# Patient Record
Sex: Female | Born: 1935 | Race: White | Hispanic: No | Marital: Single | Smoking: Never smoker
Health system: Southern US, Community
[De-identification: ages and names within clinical notes are randomized; demographics above are authoritative.]

## PROBLEM LIST (undated history)

## (undated) DIAGNOSIS — I10 Essential (primary) hypertension: Secondary | ICD-10-CM

## (undated) DIAGNOSIS — E119 Type 2 diabetes mellitus without complications: Secondary | ICD-10-CM

## (undated) DIAGNOSIS — M1990 Unspecified osteoarthritis, unspecified site: Secondary | ICD-10-CM

---

## 2014-11-24 ENCOUNTER — Emergency Department (HOSPITAL_COMMUNITY): Payer: Medicare PPO | Admitting: Anesthesiology

## 2014-11-24 ENCOUNTER — Inpatient Hospital Stay (HOSPITAL_COMMUNITY): Payer: Medicare PPO

## 2014-11-24 ENCOUNTER — Encounter (HOSPITAL_COMMUNITY): Payer: Self-pay | Admitting: Emergency Medicine

## 2014-11-24 ENCOUNTER — Encounter (HOSPITAL_COMMUNITY): Admission: EM | Disposition: E | Payer: Self-pay | Source: Home / Self Care | Attending: Internal Medicine

## 2014-11-24 ENCOUNTER — Inpatient Hospital Stay (HOSPITAL_COMMUNITY)
Admission: EM | Admit: 2014-11-24 | Discharge: 2014-12-16 | DRG: 480 | Disposition: E | Payer: Medicare PPO | Attending: Internal Medicine | Admitting: Internal Medicine

## 2014-11-24 ENCOUNTER — Emergency Department (HOSPITAL_COMMUNITY): Payer: Medicare PPO

## 2014-11-24 DIAGNOSIS — M179 Osteoarthritis of knee, unspecified: Secondary | ICD-10-CM | POA: Diagnosis present

## 2014-11-24 DIAGNOSIS — D649 Anemia, unspecified: Secondary | ICD-10-CM | POA: Diagnosis not present

## 2014-11-24 DIAGNOSIS — R001 Bradycardia, unspecified: Secondary | ICD-10-CM | POA: Diagnosis not present

## 2014-11-24 DIAGNOSIS — E1151 Type 2 diabetes mellitus with diabetic peripheral angiopathy without gangrene: Secondary | ICD-10-CM | POA: Diagnosis present

## 2014-11-24 DIAGNOSIS — E875 Hyperkalemia: Secondary | ICD-10-CM | POA: Diagnosis not present

## 2014-11-24 DIAGNOSIS — I129 Hypertensive chronic kidney disease with stage 1 through stage 4 chronic kidney disease, or unspecified chronic kidney disease: Secondary | ICD-10-CM | POA: Diagnosis present

## 2014-11-24 DIAGNOSIS — Z66 Do not resuscitate: Secondary | ICD-10-CM | POA: Diagnosis present

## 2014-11-24 DIAGNOSIS — W19XXXA Unspecified fall, initial encounter: Secondary | ICD-10-CM

## 2014-11-24 DIAGNOSIS — E1122 Type 2 diabetes mellitus with diabetic chronic kidney disease: Secondary | ICD-10-CM | POA: Diagnosis present

## 2014-11-24 DIAGNOSIS — N179 Acute kidney failure, unspecified: Secondary | ICD-10-CM | POA: Diagnosis not present

## 2014-11-24 DIAGNOSIS — Z452 Encounter for adjustment and management of vascular access device: Secondary | ICD-10-CM | POA: Insufficient documentation

## 2014-11-24 DIAGNOSIS — S7222XA Displaced subtrochanteric fracture of left femur, initial encounter for closed fracture: Secondary | ICD-10-CM | POA: Diagnosis present

## 2014-11-24 DIAGNOSIS — E872 Acidosis: Secondary | ICD-10-CM | POA: Diagnosis not present

## 2014-11-24 DIAGNOSIS — E873 Alkalosis: Secondary | ICD-10-CM | POA: Diagnosis not present

## 2014-11-24 DIAGNOSIS — Y92012 Bathroom of single-family (private) house as the place of occurrence of the external cause: Secondary | ICD-10-CM | POA: Diagnosis not present

## 2014-11-24 DIAGNOSIS — S7290XA Unspecified fracture of unspecified femur, initial encounter for closed fracture: Secondary | ICD-10-CM | POA: Diagnosis not present

## 2014-11-24 DIAGNOSIS — J9600 Acute respiratory failure, unspecified whether with hypoxia or hypercapnia: Secondary | ICD-10-CM | POA: Diagnosis not present

## 2014-11-24 DIAGNOSIS — Z515 Encounter for palliative care: Secondary | ICD-10-CM | POA: Diagnosis not present

## 2014-11-24 DIAGNOSIS — S72002A Fracture of unspecified part of neck of left femur, initial encounter for closed fracture: Secondary | ICD-10-CM

## 2014-11-24 DIAGNOSIS — S7292XA Unspecified fracture of left femur, initial encounter for closed fracture: Secondary | ICD-10-CM | POA: Diagnosis not present

## 2014-11-24 DIAGNOSIS — I998 Other disorder of circulatory system: Secondary | ICD-10-CM | POA: Diagnosis not present

## 2014-11-24 DIAGNOSIS — Z7982 Long term (current) use of aspirin: Secondary | ICD-10-CM

## 2014-11-24 DIAGNOSIS — M25552 Pain in left hip: Secondary | ICD-10-CM | POA: Diagnosis present

## 2014-11-24 DIAGNOSIS — T40605A Adverse effect of unspecified narcotics, initial encounter: Secondary | ICD-10-CM | POA: Diagnosis not present

## 2014-11-24 DIAGNOSIS — S72009A Fracture of unspecified part of neck of unspecified femur, initial encounter for closed fracture: Secondary | ICD-10-CM | POA: Diagnosis not present

## 2014-11-24 DIAGNOSIS — D72829 Elevated white blood cell count, unspecified: Secondary | ICD-10-CM | POA: Diagnosis not present

## 2014-11-24 DIAGNOSIS — R401 Stupor: Secondary | ICD-10-CM | POA: Diagnosis not present

## 2014-11-24 DIAGNOSIS — I959 Hypotension, unspecified: Secondary | ICD-10-CM | POA: Diagnosis not present

## 2014-11-24 DIAGNOSIS — N189 Chronic kidney disease, unspecified: Secondary | ICD-10-CM | POA: Diagnosis present

## 2014-11-24 DIAGNOSIS — K7200 Acute and subacute hepatic failure without coma: Secondary | ICD-10-CM | POA: Diagnosis not present

## 2014-11-24 DIAGNOSIS — R4182 Altered mental status, unspecified: Secondary | ICD-10-CM | POA: Insufficient documentation

## 2014-11-24 DIAGNOSIS — R4020 Unspecified coma: Secondary | ICD-10-CM | POA: Diagnosis not present

## 2014-11-24 DIAGNOSIS — W010XXA Fall on same level from slipping, tripping and stumbling without subsequent striking against object, initial encounter: Secondary | ICD-10-CM | POA: Diagnosis present

## 2014-11-24 DIAGNOSIS — R40241 Glasgow coma scale score 13-15: Secondary | ICD-10-CM | POA: Diagnosis not present

## 2014-11-24 DIAGNOSIS — R571 Hypovolemic shock: Secondary | ICD-10-CM | POA: Diagnosis not present

## 2014-11-24 DIAGNOSIS — M6282 Rhabdomyolysis: Secondary | ICD-10-CM | POA: Diagnosis not present

## 2014-11-24 HISTORY — DX: Type 2 diabetes mellitus without complications: E11.9

## 2014-11-24 HISTORY — PX: INTRAMEDULLARY (IM) NAIL INTERTROCHANTERIC: SHX5875

## 2014-11-24 HISTORY — DX: Unspecified osteoarthritis, unspecified site: M19.90

## 2014-11-24 HISTORY — DX: Essential (primary) hypertension: I10

## 2014-11-24 LAB — IRON AND TIBC
IRON: 47 ug/dL (ref 28–170)
SATURATION RATIOS: 19 % (ref 10.4–31.8)
TIBC: 253 ug/dL (ref 250–450)
UIBC: 206 ug/dL

## 2014-11-24 LAB — CBC WITH DIFFERENTIAL/PLATELET
BASOS ABS: 0 10*3/uL (ref 0.0–0.1)
Basophils Relative: 0 % (ref 0–1)
EOS ABS: 0 10*3/uL (ref 0.0–0.7)
Eosinophils Relative: 0 % (ref 0–5)
HCT: 33.9 % — ABNORMAL LOW (ref 36.0–46.0)
HEMOGLOBIN: 10.9 g/dL — AB (ref 12.0–15.0)
Lymphocytes Relative: 6 % — ABNORMAL LOW (ref 12–46)
Lymphs Abs: 0.9 10*3/uL (ref 0.7–4.0)
MCH: 27.5 pg (ref 26.0–34.0)
MCHC: 32.2 g/dL (ref 30.0–36.0)
MCV: 85.6 fL (ref 78.0–100.0)
MONOS PCT: 4 % (ref 3–12)
Monocytes Absolute: 0.7 10*3/uL (ref 0.1–1.0)
NEUTROS PCT: 90 % — AB (ref 43–77)
Neutro Abs: 15.1 10*3/uL — ABNORMAL HIGH (ref 1.7–7.7)
PLATELETS: 217 10*3/uL (ref 150–400)
RBC: 3.96 MIL/uL (ref 3.87–5.11)
RDW: 13 % (ref 11.5–15.5)
WBC: 16.8 10*3/uL — ABNORMAL HIGH (ref 4.0–10.5)

## 2014-11-24 LAB — CBC
HEMATOCRIT: 28.3 % — AB (ref 36.0–46.0)
Hemoglobin: 8.8 g/dL — ABNORMAL LOW (ref 12.0–15.0)
MCH: 27.4 pg (ref 26.0–34.0)
MCHC: 31.1 g/dL (ref 30.0–36.0)
MCV: 88.2 fL (ref 78.0–100.0)
PLATELETS: 210 10*3/uL (ref 150–400)
RBC: 3.21 MIL/uL — ABNORMAL LOW (ref 3.87–5.11)
RDW: 13.4 % (ref 11.5–15.5)
WBC: 12 10*3/uL — AB (ref 4.0–10.5)

## 2014-11-24 LAB — GLUCOSE, CAPILLARY
GLUCOSE-CAPILLARY: 282 mg/dL — AB (ref 65–99)
Glucose-Capillary: 279 mg/dL — ABNORMAL HIGH (ref 65–99)
Glucose-Capillary: 245 mg/dL — ABNORMAL HIGH (ref 65–99)
Glucose-Capillary: 202 mg/dL — ABNORMAL HIGH (ref 65–99)
Glucose-Capillary: 243 mg/dL — ABNORMAL HIGH (ref 65–99)

## 2014-11-24 LAB — BASIC METABOLIC PANEL
ANION GAP: 9 (ref 5–15)
BUN: 22 mg/dL — ABNORMAL HIGH (ref 6–20)
CALCIUM: 9.5 mg/dL (ref 8.9–10.3)
CO2: 24 mmol/L (ref 22–32)
CREATININE: 1.67 mg/dL — AB (ref 0.44–1.00)
Chloride: 107 mmol/L (ref 101–111)
GFR calc Af Amer: 31 mL/min — ABNORMAL LOW (ref >60)
GFR calc non Af Amer: 27 mL/min — ABNORMAL LOW (ref >60)
Glucose, Bld: 221 mg/dL — ABNORMAL HIGH (ref 65–99)
Potassium: 3.9 mmol/L (ref 3.5–5.1)
Sodium: 140 mmol/L (ref 135–145)

## 2014-11-24 LAB — POCT I-STAT 4, (NA,K, GLUC, HGB,HCT)
Glucose, Bld: 349 mg/dL — ABNORMAL HIGH (ref 65–99)
HCT: 30 % — ABNORMAL LOW (ref 36.0–46.0)
HEMOGLOBIN: 10.2 g/dL — AB (ref 12.0–15.0)
Potassium: 4.5 mmol/L (ref 3.5–5.1)
Sodium: 143 mmol/L (ref 135–145)

## 2014-11-24 LAB — RETICULOCYTES
RBC.: 3.21 MIL/uL — ABNORMAL LOW (ref 3.87–5.11)
Retic Count, Absolute: 51.4 10*3/uL (ref 19.0–186.0)
Retic Ct Pct: 1.6 % (ref 0.4–3.1)

## 2014-11-24 LAB — FERRITIN: Ferritin: 1418 ng/mL — ABNORMAL HIGH (ref 11–307)

## 2014-11-24 LAB — CREATININE, SERUM
Creatinine, Ser: 2.34 mg/dL — ABNORMAL HIGH (ref 0.44–1.00)
GFR calc non Af Amer: 18 mL/min — ABNORMAL LOW (ref >60)
GFR, EST AFRICAN AMERICAN: 21 mL/min — AB (ref >60)

## 2014-11-24 LAB — FOLATE: Folate: 22.8 ng/mL (ref >5.9)

## 2014-11-24 LAB — VITAMIN B12: Vitamin B-12: 296 pg/mL (ref 180–914)

## 2014-11-24 LAB — PROTIME-INR
INR: 1.16 (ref 0.00–1.49)
Prothrombin Time: 15 seconds (ref 11.6–15.2)

## 2014-11-24 SURGERY — FIXATION, FRACTURE, INTERTROCHANTERIC, WITH INTRAMEDULLARY ROD
Anesthesia: General | Site: Leg Upper | Laterality: Left

## 2014-11-24 MED ORDER — INSULIN ASPART 100 UNIT/ML ~~LOC~~ SOLN
0.0000 [IU] | SUBCUTANEOUS | Status: DC
  Administered 2014-11-24: 1 [IU] via SUBCUTANEOUS
  Administered 2014-11-24: 3 [IU] via SUBCUTANEOUS
  Administered 2014-11-25: 1 [IU] via SUBCUTANEOUS
  Administered 2014-11-25: 3 [IU] via SUBCUTANEOUS
  Administered 2014-11-25: 1 [IU] via SUBCUTANEOUS

## 2014-11-24 MED ORDER — PHENYLEPHRINE HCL 10 MG/ML IJ SOLN
10.0000 mg | INTRAVENOUS | Status: DC | PRN
  Administered 2014-11-24: 60 ug/min via INTRAVENOUS

## 2014-11-24 MED ORDER — METOCLOPRAMIDE HCL 10 MG PO TABS: 5.0000 mg | ORAL_TABLET | Freq: Three times a day (TID) | ORAL | Status: DC | PRN

## 2014-11-24 MED ORDER — FENTANYL CITRATE (PF) 100 MCG/2ML IJ SOLN: 25.0000 ug | INTRAMUSCULAR | Status: DC | PRN

## 2014-11-24 MED ORDER — LIDOCAINE HCL (CARDIAC) 20 MG/ML IV SOLN
INTRAVENOUS | Status: DC | PRN
  Administered 2014-11-24: 40 mg via INTRAVENOUS

## 2014-11-24 MED ORDER — BUPIVACAINE-EPINEPHRINE 0.5% -1:200000 IJ SOLN
INTRAMUSCULAR | Status: DC | PRN
  Administered 2014-11-24: 10 mL

## 2014-11-24 MED ORDER — ACETAMINOPHEN 325 MG PO TABS: 650.0000 mg | ORAL_TABLET | Freq: Four times a day (QID) | ORAL | Status: DC | PRN

## 2014-11-24 MED ORDER — SODIUM CHLORIDE 0.9 % IV SOLN
INTRAVENOUS | Status: DC
  Administered 2014-11-24: 75 mL/h via INTRAVENOUS
  Administered 2014-11-25: 10:00:00 via INTRAVENOUS

## 2014-11-24 MED ORDER — ENOXAPARIN SODIUM 30 MG/0.3ML ~~LOC~~ SOLN
30.0000 mg | SUBCUTANEOUS | Status: DC
  Filled 2014-11-24: qty 0.3

## 2014-11-24 MED ORDER — ONDANSETRON HCL 4 MG/2ML IJ SOLN
4.0000 mg | Freq: Once | INTRAMUSCULAR | Status: AC
  Administered 2014-11-24: 4 mg via INTRAVENOUS
  Filled 2014-11-24: qty 2

## 2014-11-24 MED ORDER — ONDANSETRON HCL 4 MG/2ML IJ SOLN
INTRAMUSCULAR | Status: AC
  Filled 2014-11-24: qty 2

## 2014-11-24 MED ORDER — ONDANSETRON HCL 4 MG/2ML IJ SOLN: 4.0000 mg | Freq: Once | INTRAMUSCULAR | Status: DC

## 2014-11-24 MED ORDER — CEFAZOLIN SODIUM-DEXTROSE 2-3 GM-% IV SOLR
INTRAVENOUS | Status: DC | PRN
  Administered 2014-11-24: 2 g via INTRAVENOUS

## 2014-11-24 MED ORDER — NALOXONE HCL 0.4 MG/ML IJ SOLN
0.4000 mg | Freq: Once | INTRAMUSCULAR | Status: AC
  Administered 2014-11-24: 0.4 mg via INTRAVENOUS
  Filled 2014-11-24: qty 1

## 2014-11-24 MED ORDER — FENTANYL CITRATE (PF) 100 MCG/2ML IJ SOLN
50.0000 ug | INTRAMUSCULAR | Status: DC | PRN
  Administered 2014-11-24: 50 ug via INTRAVENOUS
  Filled 2014-11-24: qty 2

## 2014-11-24 MED ORDER — ONDANSETRON HCL 4 MG/2ML IJ SOLN
4.0000 mg | Freq: Four times a day (QID) | INTRAMUSCULAR | Status: DC | PRN
  Administered 2014-11-25: 4 mg via INTRAVENOUS
  Filled 2014-11-24: qty 2

## 2014-11-24 MED ORDER — ACETAMINOPHEN 325 MG RE SUPP
487.5000 mg | RECTAL | Status: DC
  Filled 2014-11-24 (×9): qty 2

## 2014-11-24 MED ORDER — DOCUSATE SODIUM 100 MG PO CAPS
100.0000 mg | ORAL_CAPSULE | Freq: Two times a day (BID) | ORAL | Status: DC
Start: 2014-11-24 — End: 2014-11-26
  Filled 2014-11-24 (×2): qty 1

## 2014-11-24 MED ORDER — SODIUM CHLORIDE 0.9 % IV SOLN: INTRAVENOUS | Status: AC

## 2014-11-24 MED ORDER — HYDROCODONE-ACETAMINOPHEN 5-325 MG PO TABS: 1.0000 | ORAL_TABLET | Freq: Four times a day (QID) | ORAL | Status: DC | PRN

## 2014-11-24 MED ORDER — CEFAZOLIN SODIUM-DEXTROSE 2-3 GM-% IV SOLR
INTRAVENOUS | Status: AC
  Filled 2014-11-24: qty 50

## 2014-11-24 MED ORDER — METOCLOPRAMIDE HCL 5 MG PO TABS: 5.0000 mg | ORAL_TABLET | Freq: Three times a day (TID) | ORAL | Status: DC | PRN

## 2014-11-24 MED ORDER — SODIUM CHLORIDE 0.9 % IV BOLUS (SEPSIS)
1000.0000 mL | Freq: Once | INTRAVENOUS | Status: AC
  Administered 2014-11-24: 1000 mL via INTRAVENOUS

## 2014-11-24 MED ORDER — PHENOL 1.4 % MT LIQD: 1.0000 | OROMUCOSAL | Status: DC | PRN

## 2014-11-24 MED ORDER — SODIUM CHLORIDE 0.9 % IV SOLN
250.0000 [IU] | INTRAVENOUS | Status: DC | PRN
  Administered 2014-11-24: 5.8 [IU]/h via INTRAVENOUS

## 2014-11-24 MED ORDER — HYDRALAZINE HCL 20 MG/ML IJ SOLN
10.0000 mg | Freq: Once | INTRAMUSCULAR | Status: AC
  Administered 2014-11-24: 10 mg via INTRAVENOUS
  Filled 2014-11-24: qty 1

## 2014-11-24 MED ORDER — EPHEDRINE SULFATE 50 MG/ML IJ SOLN
INTRAMUSCULAR | Status: AC
  Filled 2014-11-24: qty 1

## 2014-11-24 MED ORDER — ACETAMINOPHEN 500 MG PO TABS
500.0000 mg | ORAL_TABLET | ORAL | Status: DC
  Filled 2014-11-24: qty 1

## 2014-11-24 MED ORDER — ROCURONIUM BROMIDE 50 MG/5ML IV SOLN
INTRAVENOUS | Status: AC
Start: 2014-11-24 — End: 2014-11-24
  Filled 2014-11-24: qty 1

## 2014-11-24 MED ORDER — MEPERIDINE HCL 25 MG/ML IJ SOLN: 6.2500 mg | INTRAMUSCULAR | Status: DC | PRN

## 2014-11-24 MED ORDER — MORPHINE SULFATE 2 MG/ML IJ SOLN: 2.0000 mg | INTRAMUSCULAR | Status: DC | PRN

## 2014-11-24 MED ORDER — NALOXONE HCL 0.4 MG/ML IJ SOLN
INTRAMUSCULAR | Status: AC
  Administered 2014-11-24: 0.4 mg
  Filled 2014-11-24: qty 1

## 2014-11-24 MED ORDER — SODIUM CHLORIDE 0.45 % IV SOLN: INTRAVENOUS | Status: DC

## 2014-11-24 MED ORDER — EPHEDRINE SULFATE 50 MG/ML IJ SOLN
INTRAMUSCULAR | Status: DC | PRN
  Administered 2014-11-24 (×2): 5 mg via INTRAVENOUS

## 2014-11-24 MED ORDER — FLEET ENEMA 7-19 GM/118ML RE ENEM: 1.0000 | ENEMA | Freq: Once | RECTAL | Status: AC | PRN

## 2014-11-24 MED ORDER — SUCCINYLCHOLINE CHLORIDE 20 MG/ML IJ SOLN
INTRAMUSCULAR | Status: AC
  Filled 2014-11-24: qty 1

## 2014-11-24 MED ORDER — ACETAMINOPHEN 650 MG RE SUPP: 650.0000 mg | Freq: Four times a day (QID) | RECTAL | Status: DC | PRN

## 2014-11-24 MED ORDER — PROPOFOL 10 MG/ML IV BOLUS
INTRAVENOUS | Status: DC | PRN
  Administered 2014-11-24: 50 mg via INTRAVENOUS

## 2014-11-24 MED ORDER — LIDOCAINE HCL (CARDIAC) 20 MG/ML IV SOLN
INTRAVENOUS | Status: AC
  Filled 2014-11-24: qty 5

## 2014-11-24 MED ORDER — SODIUM CHLORIDE 0.9 % IV SOLN
INTRAVENOUS | Status: DC | PRN
  Administered 2014-11-24: 15:00:00 via INTRAVENOUS

## 2014-11-24 MED ORDER — BUPIVACAINE-EPINEPHRINE (PF) 0.5% -1:200000 IJ SOLN
INTRAMUSCULAR | Status: AC
  Filled 2014-11-24: qty 30

## 2014-11-24 MED ORDER — ONDANSETRON HCL 4 MG PO TABS: 4.0000 mg | ORAL_TABLET | Freq: Four times a day (QID) | ORAL | Status: DC | PRN

## 2014-11-24 MED ORDER — FENTANYL CITRATE (PF) 250 MCG/5ML IJ SOLN
INTRAMUSCULAR | Status: DC | PRN
  Administered 2014-11-24: 25 ug via INTRAVENOUS

## 2014-11-24 MED ORDER — BUPIVACAINE-EPINEPHRINE (PF) 0.25% -1:200000 IJ SOLN
INTRAMUSCULAR | Status: AC
Start: 2014-11-24 — End: 2014-11-24
  Filled 2014-11-24: qty 30

## 2014-11-24 MED ORDER — SUCCINYLCHOLINE CHLORIDE 20 MG/ML IJ SOLN
INTRAMUSCULAR | Status: DC | PRN
  Administered 2014-11-24: 100 mg via INTRAVENOUS

## 2014-11-24 MED ORDER — PROPOFOL 10 MG/ML IV BOLUS
INTRAVENOUS | Status: AC
Start: 2014-11-24 — End: 2014-11-24
  Filled 2014-11-24: qty 20

## 2014-11-24 MED ORDER — SODIUM CHLORIDE 0.9 % IV SOLN
INTRAVENOUS | Status: DC
  Filled 2014-11-24: qty 2.5

## 2014-11-24 MED ORDER — FENTANYL CITRATE (PF) 250 MCG/5ML IJ SOLN
INTRAMUSCULAR | Status: AC
  Filled 2014-11-24: qty 5

## 2014-11-24 MED ORDER — METOCLOPRAMIDE HCL 5 MG/ML IJ SOLN: 5.0000 mg | Freq: Three times a day (TID) | INTRAMUSCULAR | Status: DC | PRN

## 2014-11-24 MED ORDER — NALOXONE HCL 0.4 MG/ML IJ SOLN
INTRAMUSCULAR | Status: AC
  Filled 2014-11-24: qty 1

## 2014-11-24 MED ORDER — MENTHOL 3 MG MT LOZG: 1.0000 | LOZENGE | OROMUCOSAL | Status: DC | PRN

## 2014-11-24 MED ORDER — METOCLOPRAMIDE HCL 5 MG/ML IJ SOLN
5.0000 mg | Freq: Three times a day (TID) | INTRAMUSCULAR | Status: DC | PRN
  Filled 2014-11-24: qty 1

## 2014-11-24 MED ORDER — ONDANSETRON HCL 4 MG/2ML IJ SOLN
INTRAMUSCULAR | Status: DC | PRN
  Administered 2014-11-24: 4 mg via INTRAVENOUS

## 2014-11-24 MED ORDER — BISACODYL 10 MG RE SUPP: 10.0000 mg | Freq: Every day | RECTAL | Status: DC | PRN

## 2014-11-24 MED ORDER — 0.9 % SODIUM CHLORIDE (POUR BTL) OPTIME
TOPICAL | Status: DC | PRN
  Administered 2014-11-24: 1000 mL

## 2014-11-24 SURGICAL SUPPLY — 46 items
BIT DRILL 4.3MMS DISTAL GRDTED (BIT) ×1 IMPLANT
BNDG COHESIVE 4X5 TAN STRL (GAUZE/BANDAGES/DRESSINGS) ×6 IMPLANT
CANISTER SUCTION 2500CC (MISCELLANEOUS) ×3 IMPLANT
COVER MAYO STAND STRL (DRAPES) ×3 IMPLANT
COVER PERINEAL POST (MISCELLANEOUS) ×3 IMPLANT
COVER SURGICAL LIGHT HANDLE (MISCELLANEOUS) ×3 IMPLANT
DRAPE C-ARM 42X72 X-RAY (DRAPES) ×3 IMPLANT
DRAPE STERI IOBAN 125X83 (DRAPES) ×3 IMPLANT
DRILL 4.3MMS DISTAL GRADUATED (BIT) ×3
DRSG PAD ABDOMINAL 8X10 ST (GAUZE/BANDAGES/DRESSINGS) ×3 IMPLANT
DURAPREP 26ML APPLICATOR (WOUND CARE) ×3 IMPLANT
ELECT REM PT RETURN 9FT ADLT (ELECTROSURGICAL) ×3
ELECTRODE REM PT RTRN 9FT ADLT (ELECTROSURGICAL) ×1 IMPLANT
EVACUATOR 1/8 PVC DRAIN (DRAIN) IMPLANT
GAUZE SPONGE 4X4 12PLY STRL (GAUZE/BANDAGES/DRESSINGS) ×3 IMPLANT
GAUZE XEROFORM 1X8 LF (GAUZE/BANDAGES/DRESSINGS) ×3 IMPLANT
GLOVE BIOGEL PI IND STRL 8 (GLOVE) ×2 IMPLANT
GLOVE BIOGEL PI INDICATOR 8 (GLOVE) ×4
GLOVE ORTHO TXT STRL SZ7.5 (GLOVE) ×6 IMPLANT
GOWN STRL REUS W/ TWL LRG LVL3 (GOWN DISPOSABLE) ×1 IMPLANT
GOWN STRL REUS W/TWL 2XL LVL3 (GOWN DISPOSABLE) ×6 IMPLANT
GOWN STRL REUS W/TWL LRG LVL3 (GOWN DISPOSABLE) ×5 IMPLANT
GUIDEWIRE BALL NOSE 80CM (WIRE) ×3 IMPLANT
HFN LH 130 DEG 11MM X 380MM (Orthopedic Implant) ×3 IMPLANT
KIT BASIN OR (CUSTOM PROCEDURE TRAY) ×3 IMPLANT
KIT ROOM TURNOVER OR (KITS) ×3 IMPLANT
LINER BOOT UNIVERSAL DISP (MISCELLANEOUS) IMPLANT
MANIFOLD NEPTUNE II (INSTRUMENTS) IMPLANT
NEEDLE HYPO 25GX1X1/2 BEV (NEEDLE) ×3 IMPLANT
NS IRRIG 1000ML POUR BTL (IV SOLUTION) ×3 IMPLANT
PACK GENERAL/GYN (CUSTOM PROCEDURE TRAY) ×3 IMPLANT
PAD ARMBOARD 7.5X6 YLW CONV (MISCELLANEOUS) ×6 IMPLANT
PIN GUIDE 3.2 903003004 (MISCELLANEOUS) ×3 IMPLANT
SCREW BONE CORTICAL 5.0X44 (Screw) ×3 IMPLANT
SCREW LAG 10.5MMX105MM HFN (Screw) ×3 IMPLANT
SCREWDRIVER HEX TIP 3.5MM (MISCELLANEOUS) ×3 IMPLANT
STAPLER VISISTAT 35W (STAPLE) ×3 IMPLANT
SUT VIC AB 0 CT1 27 (SUTURE) ×4
SUT VIC AB 0 CT1 27XBRD ANBCTR (SUTURE) ×2 IMPLANT
SUT VIC AB 1 CT1 27 (SUTURE) ×2
SUT VIC AB 1 CT1 27XBRD ANBCTR (SUTURE) ×1 IMPLANT
SUT VIC AB 2-0 CT1 27 (SUTURE)
SUT VIC AB 2-0 CT1 TAPERPNT 27 (SUTURE) IMPLANT
SYR CONTROL 10ML LL (SYRINGE) ×3 IMPLANT
TAPE CLOTH SURG 4X10 WHT LF (GAUZE/BANDAGES/DRESSINGS) ×3 IMPLANT
WATER STERILE IRR 1000ML POUR (IV SOLUTION) IMPLANT

## 2014-11-24 NOTE — ED Notes (Signed)
Fall while walking in home; slip/trip, denies dizziness or symptoms prior to fall. Landed on left hip. Pain present with palpation and movement, rotation present. No LOC with fall. Given  Morphine in route for pain.

## 2014-11-24 NOTE — Progress Notes (Signed)
[facility] answering service related to gluco stabilizer orders. RN awaiting call back.

## 2014-11-24 NOTE — ED Notes (Signed)
Admitting team at bedside.

## 2014-11-24 NOTE — H&P (View-Only) (Signed)
Reason for Consult:left femur IT/ST hip fracture Referring Physician: Regenia Skeeter MD  Belinda Newman is an 81 y.o. female.  HPI: 81 yo community ambulator goes to [employer] and to the store with family members fell in the bathroom with acute left hip pain. Neg LOC, Neg dizziness. No past Hx of hip pain. Hip very painful, short and ER. Neg Hx of hip OA.    Past Medical History  Diagnosis Date  . Diabetes mellitus without complication   . Hypertension   . Arthritis     History reviewed. No pertinent past surgical history.  History reviewed. No pertinent family history.  Social History:  reports that she has never smoked. She does not have any smokeless tobacco history on file. She reports that she does not drink alcohol or use illicit drugs.  Allergies: No Known Allergies  Medications: I have reviewed the patient's current medications.  Results for orders placed or performed during the hospital encounter of 11/18/2014 (from the past 48 hour(s))  Basic metabolic panel     Status: Abnormal   Collection Time: 11/30/2014 11:45 AM  Result Value Ref Range   Sodium 140 135 - 145 mmol/L   Potassium 3.9 3.5 - 5.1 mmol/L   Chloride 107 101 - 111 mmol/L   CO2 24 22 - 32 mmol/L   Glucose, Bld 221 (H) 65 - 99 mg/dL   BUN 22 (H) 6 - 20 mg/dL   Creatinine, Ser 1.67 (H) 0.44 - 1.00 mg/dL   Calcium 9.5 8.9 - 10.3 mg/dL   GFR calc non Af Amer 27 (L) >60 mL/min   GFR calc Af Amer 31 (L) >60 mL/min    Comment: (NOTE) The eGFR has been calculated using the CKD EPI equation. This calculation has not been validated in all clinical situations. eGFR's persistently <60 mL/min signify possible Chronic Kidney Disease.    Anion gap 9 5 - 15  CBC WITH DIFFERENTIAL     Status: Abnormal   Collection Time: 12/03/2014 11:45 AM  Result Value Ref Range   WBC 16.8 (H) 4.0 - 10.5 K/uL   RBC 3.96 3.87 - 5.11 MIL/uL   Hemoglobin 10.9 (L) 12.0 - 15.0 g/dL   HCT 33.9 (L) 36.0 - 46.0 %   MCV 85.6 78.0 - 100.0 fL   MCH 27.5 26.0 - 34.0 pg   MCHC 32.2 30.0 - 36.0 g/dL   RDW 13.0 11.5 - 15.5 %   Platelets 217 150 - 400 K/uL   Neutrophils Relative % 90 (H) 43 - 77 %   Neutro Abs 15.1 (H) 1.7 - 7.7 K/uL   Lymphocytes Relative 6 (L) 12 - 46 %   Lymphs Abs 0.9 0.7 - 4.0 K/uL   Monocytes Relative 4 3 - 12 %   Monocytes Absolute 0.7 0.1 - 1.0 K/uL   Eosinophils Relative 0 0 - 5 %   Eosinophils Absolute 0.0 0.0 - 0.7 K/uL   Basophils Relative 0 0 - 1 %   Basophils Absolute 0.0 0.0 - 0.1 K/uL  Protime-INR     Status: None   Collection Time: 12/05/2014 11:45 AM  Result Value Ref Range   Prothrombin Time 15.0 11.6 - 15.2 seconds   INR 1.16 0.00 - 1.49  Type and screen     Status: None   Collection Time: 12/07/2014 11:50 AM  Result Value Ref Range   ABO/RH(D) B POS    Antibody Screen NEG    Sample Expiration 11/27/2014     Dg Chest 1 View  12/15/2014   CLINICAL DATA:  Fall in bathroom this morning. Hypertension and diabetes.  EXAM: CHEST  1 VIEW  COMPARISON:  None.  FINDINGS: The heart size and mediastinal contours are within normal limits. Both lungs are clear. Mild elevation of left hemidiaphragm noted. No evidence of pleural effusion.  IMPRESSION: No active disease.   Electronically Signed   By: Earle Gell M.D.   On: 12/15/2014 11:44   Dg Hip Unilat With Pelvis 2-3 Views Left  11/19/2014   CLINICAL DATA:  Fall with left hip pain today.  Initial encounter.  EXAM: DG HIP (WITH OR WITHOUT PELVIS) 2-3V LEFT  COMPARISON:  None.  FINDINGS: An oblique mildly comminuted fracture of the proximal left femur is identified extending 15 cm from the greater trochanter inferiorly. 2 cm anterior and lateral displacement is identified.  There is no evidence of subluxation or dislocation.  Mild degenerative changes within both hips are present.  IMPRESSION: Oblique mildly comminuted displaced fracture of the proximal left femur as described.   Electronically Signed   By: Margarette Canada M.D.   On: 11/22/2014 11:45    Review of  Systems  Constitutional: Negative for fever and weight loss.  Eyes:       Glasses  Respiratory: Negative for cough and sputum production.   Cardiovascular: Negative for chest pain.  Gastrointestinal: Negative for vomiting.  Genitourinary: Negative for dysuria.  Musculoskeletal: Negative for myalgias.  Skin: Negative for rash.  Neurological: Negative for dizziness, tremors and seizures.  Endo/Heme/Allergies: Does not bruise/bleed easily.   Blood pressure 105/46, pulse 67, temperature 97.9 F (36.6 C), temperature source Oral, resp. rate 21, height 5' 6"  (1.676 m), weight 68.04 kg (150 lb), SpO2 92 %. Physical Exam  Constitutional: She is oriented to person, place, and time. She appears well-developed and well-nourished.  HENT:  Head: Atraumatic.  Eyes: Pupils are equal, round, and reactive to light.  Neck: Normal range of motion.  Cardiovascular: Normal rate and regular rhythm.   Respiratory: Effort normal and breath sounds normal.  GI: Soft.  Musculoskeletal:  Pulses intact, hip lateral skin intact. Pulses and sensation intact left LE . Short and ER left LE.   Neurological: She is alert and oriented to person, place, and time.  Skin: Skin is warm.  Psychiatric: She has a normal mood and affect. Her behavior is normal.    Assessment/Plan: Left IT/ST hip /proximal femoral shaft fracture.   Had 65m Morphine given with oversedation after EMS brought her to ER. BP improved with fluid.   Plan surgery discussed with sister, daughter . All ?'s answered . Pt and family request we proceed risks of bleeding infection , screw cutout and reoperation, anesthetic risks , heart attack , stroke discussed.    YATES,MARK C 12/02/2014, 3:00 PM

## 2014-11-24 NOTE — Significant Event (Signed)
Rapid Response Event Note  Overview: Time Called: 2045 Arrival Time: 2050 Event Type: Neurologic  Initial Focused Assessment: Pt was unresponsive to verbal and pain stimuli. Airway is patent, heart sounds regular with rate 48-54, lungs sounds diminished, skin warm with cool extremities. Pupils 2mm and sluggish. Sensation was intact.   Interventions: Patient was given Narcan 0.4mg  X 2.   Event Summary: Patient had returned from PACU today around 17:35 per family. Patient had been sleeping since return. Staff and family had difficulty arousing patient this evening around 20:30. There was concern for oversedation. Patient was given Narcan 0.4mg  X 2 before responding to any stimuli. MAE X 4. CBG was 202-245 during event. Patient was placed on a continuous pulse ox and remained on RA. No further interventions were required during event. Provider responded to bedside.   Name of Physician Notified: Dr. Venia MinksNicholas Taylor at 2045    at 2045  Outcome: Stayed in room and stabalized  Event End Time: 2115  Belinda Newman, Belinda Newman

## 2014-11-24 NOTE — Consult Note (Signed)
Reason for Consult:left femur IT/ST hip fracture Referring Physician: Regenia Skeeter MD  Belinda Newman is an 81 y.o. female.  HPI: 81 yo community ambulator goes to [employer] and to the store with family members fell in the bathroom with acute left hip pain. Neg LOC, Neg dizziness. No past Hx of hip pain. Hip very painful, short and ER. Neg Hx of hip OA.    Past Medical History  Diagnosis Date  . Diabetes mellitus without complication   . Hypertension   . Arthritis     History reviewed. No pertinent past surgical history.  History reviewed. No pertinent family history.  Social History:  reports that she has never smoked. She does not have any smokeless tobacco history on file. She reports that she does not drink alcohol or use illicit drugs.  Allergies: No Known Allergies  Medications: I have reviewed the patient's current medications.  Results for orders placed or performed during the hospital encounter of 11/22/2014 (from the past 48 hour(s))  Basic metabolic panel     Status: Abnormal   Collection Time: 12/15/2014 11:45 AM  Result Value Ref Range   Sodium 140 135 - 145 mmol/L   Potassium 3.9 3.5 - 5.1 mmol/L   Chloride 107 101 - 111 mmol/L   CO2 24 22 - 32 mmol/L   Glucose, Bld 221 (H) 65 - 99 mg/dL   BUN 22 (H) 6 - 20 mg/dL   Creatinine, Ser 1.67 (H) 0.44 - 1.00 mg/dL   Calcium 9.5 8.9 - 10.3 mg/dL   GFR calc non Af Amer 27 (L) >60 mL/min   GFR calc Af Amer 31 (L) >60 mL/min    Comment: (NOTE) The eGFR has been calculated using the CKD EPI equation. This calculation has not been validated in all clinical situations. eGFR's persistently <60 mL/min signify possible Chronic Kidney Disease.    Anion gap 9 5 - 15  CBC WITH DIFFERENTIAL     Status: Abnormal   Collection Time: 11/19/2014 11:45 AM  Result Value Ref Range   WBC 16.8 (H) 4.0 - 10.5 K/uL   RBC 3.96 3.87 - 5.11 MIL/uL   Hemoglobin 10.9 (L) 12.0 - 15.0 g/dL   HCT 33.9 (L) 36.0 - 46.0 %   MCV 85.6 78.0 - 100.0 fL   MCH 27.5 26.0 - 34.0 pg   MCHC 32.2 30.0 - 36.0 g/dL   RDW 13.0 11.5 - 15.5 %   Platelets 217 150 - 400 K/uL   Neutrophils Relative % 90 (H) 43 - 77 %   Neutro Abs 15.1 (H) 1.7 - 7.7 K/uL   Lymphocytes Relative 6 (L) 12 - 46 %   Lymphs Abs 0.9 0.7 - 4.0 K/uL   Monocytes Relative 4 3 - 12 %   Monocytes Absolute 0.7 0.1 - 1.0 K/uL   Eosinophils Relative 0 0 - 5 %   Eosinophils Absolute 0.0 0.0 - 0.7 K/uL   Basophils Relative 0 0 - 1 %   Basophils Absolute 0.0 0.0 - 0.1 K/uL  Protime-INR     Status: None   Collection Time: 11/27/2014 11:45 AM  Result Value Ref Range   Prothrombin Time 15.0 11.6 - 15.2 seconds   INR 1.16 0.00 - 1.49  Type and screen     Status: None   Collection Time: 12/11/2014 11:50 AM  Result Value Ref Range   ABO/RH(D) B POS    Antibody Screen NEG    Sample Expiration 11/27/2014     Dg Chest 1 View  11/27/2014   CLINICAL DATA:  Fall in bathroom this morning. Hypertension and diabetes.  EXAM: CHEST  1 VIEW  COMPARISON:  None.  FINDINGS: The heart size and mediastinal contours are within normal limits. Both lungs are clear. Mild elevation of left hemidiaphragm noted. No evidence of pleural effusion.  IMPRESSION: No active disease.   Electronically Signed   By: Earle Gell M.D.   On: 11/28/2014 11:44   Dg Hip Unilat With Pelvis 2-3 Views Left  12/11/2014   CLINICAL DATA:  Fall with left hip pain today.  Initial encounter.  EXAM: DG HIP (WITH OR WITHOUT PELVIS) 2-3V LEFT  COMPARISON:  None.  FINDINGS: An oblique mildly comminuted fracture of the proximal left femur is identified extending 15 cm from the greater trochanter inferiorly. 2 cm anterior and lateral displacement is identified.  There is no evidence of subluxation or dislocation.  Mild degenerative changes within both hips are present.  IMPRESSION: Oblique mildly comminuted displaced fracture of the proximal left femur as described.   Electronically Signed   By: Margarette Canada M.D.   On: 11/16/2014 11:45    Review of  Systems  Constitutional: Negative for fever and weight loss.  Eyes:       Glasses  Respiratory: Negative for cough and sputum production.   Cardiovascular: Negative for chest pain.  Gastrointestinal: Negative for vomiting.  Genitourinary: Negative for dysuria.  Musculoskeletal: Negative for myalgias.  Skin: Negative for rash.  Neurological: Negative for dizziness, tremors and seizures.  Endo/Heme/Allergies: Does not bruise/bleed easily.   Blood pressure 105/46, pulse 67, temperature 97.9 F (36.6 C), temperature source Oral, resp. rate 21, height 5' 6"  (1.676 m), weight 68.04 kg (150 lb), SpO2 92 %. Physical Exam  Constitutional: She is oriented to person, place, and time. She appears well-developed and well-nourished.  HENT:  Head: Atraumatic.  Eyes: Pupils are equal, round, and reactive to light.  Neck: Normal range of motion.  Cardiovascular: Normal rate and regular rhythm.   Respiratory: Effort normal and breath sounds normal.  GI: Soft.  Musculoskeletal:  Pulses intact, hip lateral skin intact. Pulses and sensation intact left LE . Short and ER left LE.   Neurological: She is alert and oriented to person, place, and time.  Skin: Skin is warm.  Psychiatric: She has a normal mood and affect. Her behavior is normal.    Assessment/Plan: Left IT/ST hip /proximal femoral shaft fracture.   Had 10m Morphine given with oversedation after EMS brought her to ER. BP improved with fluid.   Plan surgery discussed with sister, daughter . All ?'s answered . Pt and family request we proceed risks of bleeding infection , screw cutout and reoperation, anesthetic risks , heart attack , stroke discussed.    Belinda Newman C 12/15/2014, 3:00 PM

## 2014-11-24 NOTE — ED Notes (Signed)
Dr. Ophelia CharterYates at bedside discussing plan with patient and family.

## 2014-11-24 NOTE — ED Notes (Signed)
Pt moving arms and head to sternal rub, mumbling incomprehensibly.

## 2014-11-24 NOTE — Brief Op Note (Signed)
12/06/2014  5:11 PM  PATIENT:  Belinda Newman  81 y.o. female  PRE-OPERATIVE DIAGNOSIS:  LEFT IT/ST HIP FRACTURE  POST-OPERATIVE DIAGNOSIS:  LEFT IT/ST HIP FRACTURE  PROCEDURE:  Procedure(s): INTRAMEDULLARY (IM) NAIL INTERTROCHANTRIC (Left) affixis long nail with interlocks  SURGEON:  Surgeon(s) and Role:    * Eldred MangesMark C Sanford Lindblad, MD - Primary  PHYSICIAN ASSISTANT:   ASSISTANTS: none   ANESTHESIA:   general plus local   EBL:  Total I/O In: -  Out: 100 [Blood:100]  BLOOD ADMINISTERED:none  DRAINS: none   LOCAL MEDICATIONS USED:  MARCAINE     SPECIMEN:  No Specimen  DISPOSITION OF SPECIMEN:  N/A  COUNTS:  YES  TOURNIQUET:  * No tourniquets in log *  DICTATION: .Other Dictation: Dictation Number [id number]  PLAN OF CARE: Admit to inpatient   PATIENT DISPOSITION:  PACU - hemodynamically stable.   Delay start of Pharmacological VTE agent (>24hrs) due to surgical blood loss or risk of bleeding: yes

## 2014-11-24 NOTE — ED Notes (Signed)
Pt responding verbally.

## 2014-11-24 NOTE — Anesthesia Procedure Notes (Signed)
Procedure Name: Intubation Date/Time: 12/15/2014 3:38 PM Performed by: Alanda AmassFRIEDMAN, Traevion Poehler A Pre-anesthesia Checklist: Patient identified, Timeout performed, Emergency Drugs available, Suction available and Patient being monitored Patient Re-evaluated:Patient Re-evaluated prior to inductionOxygen Delivery Method: Circle system utilized Preoxygenation: Pre-oxygenation with 100% oxygen Intubation Type: IV induction Ventilation: Mask ventilation without difficulty Laryngoscope Size: Mac and 3 Grade View: Grade I Tube type: Oral Tube size: 7.5 mm Number of attempts: 1 Airway Equipment and Method: Stylet Placement Confirmation: ETT inserted through vocal cords under direct vision,  breath sounds checked- equal and bilateral and positive ETCO2 Secured at: 20 cm Tube secured with: Tape Dental Injury: Teeth and Oropharynx as per pre-operative assessment

## 2014-11-24 NOTE — Progress Notes (Signed)
RN went in to discontinue insulin drip and administer novolog sliding scale (3 units) per MD order. Patients family (daughter Eber JonesCarolyn and patients sister) were in the room and expressed concerns about how their mother was not alert as she usually was. RN reassessed patient. Patient was unresponsive to voice and pain. When RN originally assessed patient at the beginning of shift change patient was able to respond to RN (saying yes and no and that she did not feel like opening her eyes when the RN asked her to.)  RN called Rapid Response. Gerri SporeWesley, RN responded to Rapid Response call. Rapid Response ordered patient Narcan times 2. RN administered Narcan at 2056 and 2058. Patient did not respond to first administration of Narcan. Patient did respond to second dose of Narcan. After administration of second dose of Narcan patient began to respond to pain and grab at objects (such as her gown, oxygen tubing). RN notified Internal Medicine that Rapid Response had been paged for patient and was at the bedside. Internal Medicine ordered RN to get another glucose reading on patient and current set of vital signs. NT got glucose reading of (243). Patients vital signs were temperature 97.3, blood pressure 111/48, pulse 54, oxygen 100 %  5 liters oxygen non-rebreather mask. Internal Medicine came to the bedside at 2115 Tasia Catchings(Ahmed, MD and Ladona Ridgelaylor, Resident). Patient was still somewhat lethargic at that point. Ahmed, MD ordered one additional dose of Narcan (which ended up being held because the patient ended up becoming more alert. Patient opened her eyes and looked at the RN.) MD ordered RN to discontinue any future administrations of narcotic pain medications unless patient is able to verbally state that she is in pain and to insert a foley catheter (per daughter Carolyn's request). MD notified RN to alert him to any additional updates. NT gave patient a bath. RN and NT changed patients linens. RN inserted foley catheter. Family still  at the bedside. Nursing will continue to monitor.

## 2014-11-24 NOTE — ED Notes (Signed)
Patient transported to X-ray 

## 2014-11-24 NOTE — Anesthesia Postprocedure Evaluation (Signed)
  Anesthesia Post-op Note  Patient: Marguerita MerlesHenrietta Fetterolf  Procedure(s) Performed: Procedure(s): INTRAMEDULLARY (IM) NAIL INTERTROCHANTRIC (Left)  Patient Location: PACU  Anesthesia Type: General   Level of Consciousness: awake, alert  and oriented  Airway and Oxygen Therapy: Patient Spontanous Breathing  Post-op Pain: mild  Post-op Assessment: Post-op Vital signs reviewed  Post-op Vital Signs: Reviewed  Last Vitals:  Filed Vitals:   11-Apr-2015 2353  BP: 108/53  Pulse: 61  Temp: 36.4 C  Resp: 16    Complications: No apparent anesthesia complications

## 2014-11-24 NOTE — ED Notes (Signed)
Patient returned from X-ray 

## 2014-11-24 NOTE — Op Note (Signed)
NAMMarguerita Merles:  Newman, Belinda          ACCOUNT NO.:  [id number]  MEDICAL RECORD NO.:  [id number]  LOCATION:  5N12C                        FACILITY:  [facility]  PHYSICIAN:  Mark C. Ophelia CharterYates, M.D.    DATE OF BIRTH:  [birth year]  DATE OF PROCEDURE:  March 17, 2015 DATE OF DISCHARGE:                              OPERATIVE REPORT   PREOPERATIVE DIAGNOSIS:  Left subtrochanteric hip fracture.  POSTOPERATIVE DIAGNOSIS:  Left subtrochanteric hip fracture.  PROCEDURE:  Left Affixus trochanteric nail for a subtrochanteric hip fracture.  SURGEON:  Mark C. Ophelia CharterYates, M.D.  ANESTHESIA:  General plus Marcaine local.  ESTIMATED BLOOD LOSS:  100 mL.  COMPLICATIONS:  None.  IMPLANTS:  Affixus nail 280 with 105 mm lag screw.  44 mm distal interlock screw.  DESCRIPTION OF PROCEDURE:  After induction of anesthesia, the patient was placed on the fracture table.  Careful padding, positioning, Well leg holder, and traction internal rotation.  Patella was pointed toward the ceiling and C-arm was brought in.  Reduction was performed.  Once that was in satisfactory position up to length, lateral still showed that the distal femur segs posteriorly.  Prepping with DuraPrep, the area was squared with towels.  Large shower curtain, Betadine, Steri- Drape, application after time-out procedure, Ancef prophylaxis.  Lateral incision was made above the trochanter.  The gluteus fascia was split pin and was placed at the tip of the trochanter checked under fluoroscopy, drilled, over reamed, and then the beaded tip rod with a long finger.  Guide was used.  A crutch was needed underneath the midportion of the femur, pushed up, which aligned it, so that the beaded tip rod could be passed across down to the knee checked under fluoro, AP, and lateral, and then a standard technique for placing the guide pin up the neck, low in the neck on AP and perfectly in the center of the neck on the lateral center of the head on lateral.  It was  reamed, measured, 105 screw placed, tightened down, backed up a quarter turn, and the lateral guide jig was removed.  Freehand technique was used for the distal femur.  A 44 mm screw was placed, checked under AP and lateral.  Had good bicortical fit.  X-ray at the fracture site showed satisfactory position and alignment.  Irrigation with saline solution, deep fascia closed with #1 Vicryl and 2-0 Vicryl subcutaneous tissue, skin staple closure Marcaine infiltration.  All 3 areas.  The patient tolerated the procedure well. Dressing was applied.  4x4s tape and transferred to the recovery room.     Mark C. Ophelia CharterYates, M.D.     MCY/MEDQ  D:  March 17, 2015  T:  March 17, 2015  Job:  [id number]

## 2014-11-24 NOTE — Progress Notes (Signed)
MD on call for night shift called RN back and ordered RN to get another glucose reading and text page him the results. Nursing will continue to monitor.

## 2014-11-24 NOTE — ED Provider Notes (Signed)
CSN: [id number]     Arrival date & time 12/12/2014  1014 History   First MD Initiated Contact with Patient 12/15/2014 1014     Chief Complaint  Patient presents with  . Fall  . Hip Pain     (Consider location/radiation/quality/duration/timing/severity/associated sxs/prior Treatment) HPI  81 year old female presents after slipping in her bathroom and injuring her left hip. The patient was unable to get up due to the amount of pain she was in. EMS has given her 10 mg of morphine, pain is improved but still significantly present. No weakness or numbness in her extremity. Did not hit her head or injure any other body parts. Denies any preceding palpitations or dizziness. No chest pain.  Past Medical History  Diagnosis Date  . Diabetes mellitus without complication   . Hypertension   . Arthritis    History reviewed. No pertinent past surgical history. History reviewed. No pertinent family history. History  Substance Use Topics  . Smoking status: Never Smoker   . Smokeless tobacco: Not on file  . Alcohol Use: No   OB History    No data available     Review of Systems  Respiratory: Negative for shortness of breath.   Cardiovascular: Negative for chest pain.  Musculoskeletal: Positive for arthralgias.  Neurological: Negative for weakness, numbness and headaches.  All other systems reviewed and are negative.     Allergies  Review of patient's allergies indicates no known allergies.  Home Medications   Prior to Admission medications   Not on File   BP 246/84 mmHg  Pulse 84  Temp(Src) 97.9 F (36.6 C) (Oral)  Ht 5\' 6"  (1.676 m)  Wt 150 lb (68.04 kg)  BMI 24.22 kg/m2  SpO2 100% Physical Exam  Constitutional: She is oriented to person, place, and time. She appears well-developed and well-nourished.  HENT:  Head: Normocephalic and atraumatic.  Right Ear: External ear normal.  Left Ear: External ear normal.  Nose: Nose normal.  Eyes: Right eye exhibits no discharge. Left  eye exhibits no discharge.  Cardiovascular: Normal rate, regular rhythm and normal heart sounds.   DP pulses biphasic on doppler bilaterally  Pulmonary/Chest: Effort normal and breath sounds normal.  Abdominal: Soft. She exhibits no distension. There is no tenderness.  Musculoskeletal:       Left hip: She exhibits decreased range of motion, tenderness and deformity (shortened and externally rotated).  Normal sensation and strength in feet bilaterally. Normal capillary refill  Neurological: She is alert and oriented to person, place, and time.  Skin: Skin is warm and dry.  Nursing note and vitals reviewed.   ED Course  Procedures (including critical care time) Labs Review Labs Reviewed  BASIC METABOLIC PANEL - Abnormal; Notable for the following:    Glucose, Bld 221 (*)    BUN 22 (*)    Creatinine, Ser 1.67 (*)    GFR calc non Af Amer 27 (*)    GFR calc Af Amer 31 (*)    All other components within normal limits  CBC WITH DIFFERENTIAL/PLATELET - Abnormal; Notable for the following:    WBC 16.8 (*)    Hemoglobin 10.9 (*)    HCT 33.9 (*)    Neutrophils Relative % 90 (*)    Neutro Abs 15.1 (*)    Lymphocytes Relative 6 (*)    All other components within normal limits  PROTIME-INR  TYPE AND SCREEN  ABO/RH    Imaging Review Dg Chest 1 View  11/19/2014   CLINICAL DATA:  Fall in bathroom this morning. Hypertension and diabetes.  EXAM: CHEST  1 VIEW  COMPARISON:  None.  FINDINGS: The heart size and mediastinal contours are within normal limits. Both lungs are clear. Mild elevation of left hemidiaphragm noted. No evidence of pleural effusion.  IMPRESSION: No active disease.   Electronically Signed   By: Myles Rosenthal M.D.   On: Nov 26, 2014 11:44   Dg Hip Unilat With Pelvis 2-3 Views Left  11/26/14   CLINICAL DATA:  Fall with left hip pain today.  Initial encounter.  EXAM: DG HIP (WITH OR WITHOUT PELVIS) 2-3V LEFT  COMPARISON:  None.  FINDINGS: An oblique mildly comminuted fracture of  the proximal left femur is identified extending 15 cm from the greater trochanter inferiorly. 2 cm anterior and lateral displacement is identified.  There is no evidence of subluxation or dislocation.  Mild degenerative changes within both hips are present.  IMPRESSION: Oblique mildly comminuted displaced fracture of the proximal left femur as described.   Electronically Signed   By: Harmon Pier M.D.   On: Nov 26, 2014 11:45     EKG Interpretation   Date/Time:  Sunday 2014-11-26 10:32:41 EDT Ventricular Rate:  89 PR Interval:  163 QRS Duration: 113 QT Interval:  419 QTC Calculation: 510 R Axis:   -45 Text Interpretation:  Sinus rhythm Abnormal R-wave progression, early  transition LVH with IVCD, LAD and secondary repol abnrm Prolonged QT  interval No old tracing to compare Confirmed by Angell Honse  MD, Katelynn Heidler ([id number])  on November 26, 2014 10:38:56 AM      MDM   Final diagnoses:  Fracture, proximal femur, left, closed, initial encounter    Patient awake, alert, and neurovascular intact on arrival. X-rays consistent with a comminuted proximal femur fracture. No other signs of injury and no other complaints of pain. Discussed with orthopedics, Dr. Ophelia Charter, who will take the patient to the operating room this afternoon. While in the ED she was noted to be quite hypertensive, 246/84. While she's not symptomatically she had not taken her BP meds this morning. She was given 10 mg of hydralazine. Her pressure came down but then over shot into the 70s and 80s. During this time she is also more lethargic. When given a small dose of Narcan she woke up more and her blood pressure improved with a systolic over 100. I believe that the hydralazine helped bring her pressure down but also the 10 mg of morphine she's given by EMS has caught up to her. She will need close monitoring and admission to the internal medicine teaching service as well as her orthopedic repair. Airway has remained stable while in ED.    Pricilla Loveless, MD 11-26-2014 [phone]

## 2014-11-24 NOTE — H&P (Signed)
Date: November 26, 2014               Patient Name:  Belinda Newman MRN: [id number]  DOB: [birth year] Age / Sex: 81 y.o., female   PCP: Marcell Anger, NP              Medical Service: Internal Medicine Teaching Service              Attending Physician: Dr. Doneen Poisson, MD    First Contact: Azzie Roup, MS 3 Pager: [phone]  Second Contact: Dr. Orest Dikes Pager: [phone]  Third Contact Dr. Inocente Salles Pager: [phone]       After Hours (After 5p/  First Contact Pager: [phone]  weekends / holidays): Second Contact Pager: [phone]   Chief Complaint:   History of Present Illness: Belinda Newman is a 81 y.o. female with a past history significant for T2DM, HTN, and arthritis of the right knee, who presents after a fall on left hip pain resulting in severe left hip pain. Belinda Newman reports around 8am today (11-26-2014) she had a "mistep" coming out of the bathroom, and fell onto her left hip. Event was unwitnessed. Due to extreme pain in her left hip, she was unable to get up, and was found lying on floor minutes later by her grandson. Belinda Newman denies hitting her head. She was alert after the fall per family, and brought to the hospital by EMS due to severe pain in left hip. Received 10 mg of morphine from EMS, pain is improving but still significantly present. She was hypertensive at 246/84, then given hydralazine IV and 50 mcg fentanyl IV. She became hypotensive and sedated, which was partially reversed when given naloxone.  Prior to the event, she denied feeling dizziness, chest pain, or seizure. At the time of the interview, Belinda Newman denies chest pain, headache, vision changes, cough, and SOB.  Prior to fall report having been in generally good health, with no recent illness or fever. Of note, family members state she [name] have never had a serious fall required hospitalization before, however does experience minor falls approximately every other month.  History was  taken from pt, pt's sister, and pt's daugther.  PCP: Henreitta Leber NP, [place]  Meds: Current Facility-Administered Medications  Medication Dose Route Frequency Provider Last Rate Last Dose  . 0.9 %  sodium chloride infusion   Intravenous Continuous Ejiroghene E Emokpae, MD      . fentaNYL (SUBLIMAZE) injection 25-50 mcg  25-50 mcg Intravenous Q5 min PRN Sheldon Silvan, MD      . Mitzi Hansen Hold] fentaNYL (SUBLIMAZE) injection 50 mcg  50 mcg Intravenous Q30 min PRN Pricilla Loveless, MD   50 mcg at 11/26/14 1039  . insulin regular (NOVOLIN R,HUMULIN R) 250 Units in sodium chloride 0.9 % 250 mL (1 Units/mL) infusion   Intravenous Continuous Sheldon Silvan, MD      . meperidine (DEMEROL) injection 6.25 mg  6.25 mg Intravenous Q5 min PRN Sheldon Silvan, MD       Facility-Administered Medications Ordered in Other Encounters  Medication Dose Route Frequency Provider Last Rate Last Dose  . 0.9 %  sodium chloride infusion    Continuous PRN Atilano Ina, CRNA      . ceFAZolin (ANCEF) IVPB 2 g/50 mL premix   Intravenous Anesthesia Intra-op Atilano Ina, CRNA   2 g at November 26, 2014 1540  . ePHEDrine injection    Anesthesia Intra-op Atilano Ina, CRNA   5 mg at 11-26-2014 1536  .  fentaNYL (SUBLIMAZE) injection    Anesthesia Intra-op Atilano Ina, CRNA   25 mcg at 12/12/2014 1614  . insulin regular (NOVOLIN R,HUMULIN R) 250 Units in sodium chloride 0.9 % 250 mL infusion  250 Units  Continuous PRN Atilano Ina, CRNA 5.8 mL/hr at 12/04/2014 1656 5.8 Units/hr at 12/14/2014 1656  . lidocaine (cardiac) 100 mg/42ml (XYLOCAINE) 20 MG/ML injection 2%    Anesthesia Intra-op Atilano Ina, CRNA   40 mg at 12/12/2014 1537  . ondansetron [facility]) injection    Anesthesia Intra-op Atilano Ina, CRNA   4 mg at 12/14/2014 1703  . phenylephrine (NEO-SYNEPHRINE) 0.04 mg/mL in dextrose 5 % 250 mL infusion  10 mg  Continuous PRN Atilano Ina, CRNA 90 mL/hr at 12/15/2014 1706 60 mcg/min at 11/23/2014 1706  . propofol (DIPRIVAN) 10 mg/mL  bolus/IV push    Anesthesia Intra-op Atilano Ina, CRNA   50 mg at 12/14/2014 1537  . succinylcholine (ANECTINE) injection    Anesthesia Intra-op Atilano Ina, CRNA   100 mg at 11/20/2014 1537    Allergies: Allergies as of 12/15/2014  . (No Known Allergies)    Past Medical History: Past Medical History  Diagnosis Date  . Diabetes mellitus without complication   . Hypertension   . Arthritis     Past Surgical History: History reviewed. No pertinent past surgical history.  Past Family History: History reviewed. No pertinent family history.  Past Social History: History   Social History  . Marital Status: Single    Spouse Name: N/A  . Number of Children: N/A  . Years of Education: N/A   Occupational History  . Not on file.   Social History Main Topics  . Smoking status: Never Smoker   . Smokeless tobacco: Not on file  . Alcohol Use: No  . Drug Use: No  . Sexual Activity: Not on file   Other Topics Concern  . Not on file   Social History Narrative   Lives at home with grandson     Review of Systems: Denies diarrhea, vomiting, change in appetite, dysuria, cough, stomach pain, or changes in memory, otherwise ROS as mentioned in HPI.  Physical Exam: Blood pressure 105/46, pulse 67, temperature 97.9 F (36.6 C), temperature source Oral, resp. rate 21, height 5\' 6"  (1.676 m), weight 68.04 kg (150 lb), SpO2 92 %. BP 105/46 mmHg  Pulse 67  Temp(Src) 97.9 F (36.6 C) (Oral)  Resp 21  Ht 5\' 6"  (1.676 m)  Wt 68.04 kg (150 lb)  BMI 24.22 kg/m2  SpO2 92%  GENERAL: drowsy female lying in bed, cooperative, closes eyes in between answering questions HEENT: pupils equal round and reactive to light bilaterally; conjunctiva clear bilaterally; neck supple; normocephalic & atraumatic; oral mucosa appears moist CV: Regularly irregular rhythm, normal rate, no murmurs, poor peripheral pulses Lung: LCAB, no increased work of breathing, no wheezes/crackles Abd: soft,  nontender, no guarding or rebound, no palpable masses Neuro: normal sensation in UE and LE bilaterally; grossly oriented; closes eyes against resistance; symmetric smile; symmetric tongue protrusion. Normal strength on lateral head rotation and shoulder shrug bilaterally MSK: muscle strength 5/5 at bicep and triceps bilaterally, 2/5 at RLE, and unable to assess at LLE. Severe tenderness on palpation of LLE, trace pedal edema bilaterally   Lab results: Results for orders placed or performed during the hospital encounter of 11/28/2014 (from the past 24 hour(s))  Basic metabolic panel     Status: Abnormal   Collection Time: 11/19/2014 11:45  AM  Result Value Ref Range   Sodium 140 135 - 145 mmol/L   Potassium 3.9 3.5 - 5.1 mmol/L   Chloride 107 101 - 111 mmol/L   CO2 24 22 - 32 mmol/L   Glucose, Bld 221 (H) 65 - 99 mg/dL   BUN 22 (H) 6 - 20 mg/dL   Creatinine, Ser 9.561.67 (H) 0.44 - 1.00 mg/dL   Calcium 9.5 8.9 - 21.310.3 mg/dL   GFR calc non Af Amer 27 (L) >60 mL/min   GFR calc Af Amer 31 (L) >60 mL/min   Anion gap 9 5 - 15  CBC WITH DIFFERENTIAL     Status: Abnormal   Collection Time: 12/08/2014 11:45 AM  Result Value Ref Range   WBC 16.8 (H) 4.0 - 10.5 K/uL   RBC 3.96 3.87 - 5.11 MIL/uL   Hemoglobin 10.9 (L) 12.0 - 15.0 g/dL   HCT 08.633.9 (L) 57.836.0 - 46.946.0 %   MCV 85.6 78.0 - 100.0 fL   MCH 27.5 26.0 - 34.0 pg   MCHC 32.2 30.0 - 36.0 g/dL   RDW 62.913.0 52.811.5 - 41.315.5 %   Platelets 217 150 - 400 K/uL   Neutrophils Relative % 90 (H) 43 - 77 %   Neutro Abs 15.1 (H) 1.7 - 7.7 K/uL   Lymphocytes Relative 6 (L) 12 - 46 %   Lymphs Abs 0.9 0.7 - 4.0 K/uL   Monocytes Relative 4 3 - 12 %   Monocytes Absolute 0.7 0.1 - 1.0 K/uL   Eosinophils Relative 0 0 - 5 %   Eosinophils Absolute 0.0 0.0 - 0.7 K/uL   Basophils Relative 0 0 - 1 %   Basophils Absolute 0.0 0.0 - 0.1 K/uL  Protime-INR     Status: None   Collection Time: 11/19/2014 11:45 AM  Result Value Ref Range   Prothrombin Time 15.0 11.6 - 15.2 seconds    INR 1.16 0.00 - 1.49  Type and screen     Status: None   Collection Time: 11/23/2014 11:50 AM  Result Value Ref Range   ABO/RH(D) B POS    Antibody Screen NEG    Sample Expiration 11/27/2014     Imaging results:  Dg Chest 1 View  11/19/2014   CLINICAL DATA:  Fall in bathroom this morning. Hypertension and diabetes.  EXAM: CHEST  1 VIEW  COMPARISON:  None.  FINDINGS: The heart size and mediastinal contours are within normal limits. Both lungs are clear. Mild elevation of left hemidiaphragm noted. No evidence of pleural effusion.  IMPRESSION: No active disease.   Electronically Signed   By: Myles RosenthalJohn  Stahl M.D.   On: 11/26/2014 11:44   Dg Hip Unilat With Pelvis 2-3 Views Left  12/02/2014   CLINICAL DATA:  Fall with left hip pain today.  Initial encounter.  EXAM: DG HIP (WITH OR WITHOUT PELVIS) 2-3V LEFT  COMPARISON:  None.  FINDINGS: An oblique mildly comminuted fracture of the proximal left femur is identified extending 15 cm from the greater trochanter inferiorly. 2 cm anterior and lateral displacement is identified.  There is no evidence of subluxation or dislocation.  Mild degenerative changes within both hips are present.  IMPRESSION: Oblique mildly comminuted displaced fracture of the proximal left femur as described.   Electronically Signed   By: Harmon PierJeffrey  Hu M.D.   On: 12/11/2014 11:45    Other results: EKG: normal sinus rhythm, rate 89 bpm, left axis deviation (-35), normal P wave (0.12s), normal PR interval (0.16s), widened QRS (0.16s),  early R/S transition (R/S ratio >1 in V2), normal QT int (0.40s)  There are no previous tracings available for comparison. Telemetry: showed occasional PVCs   Assessment & Plan by Problem: Active Problems:   Closed fracture of left femur   Femur fracture   Fracture, subtrochanteric, left femur, closed  Ms. Veno is an 81 year old female with past medical history of T2DM, HTN, and arthritis who presents after a fall causing severe left hip pain. X-ray  of the left pelvis shows an oblique mildly comminuted displaced fracture of the proximal left femur.  Closed Proximal Left Femur Fracture:  - surgery repair today - NPO for surgery - NS fluid 120mL/hr IV - Fentanyl 50 mcg q30 min PRN severe pain  HTN: Patient had a BP of 246/84 when first presented at ED. Given 10mg  Hydralazine in ED and 10mg  morphine from EMS, causing BP to decrease significantly to the 80s/40s. BP to 100s with 0.4 mg naloxone and 1L NS fluid bolus -d/c hydralazine and home meds  DMT2: home meds include Metformin - hold Metformin - start Sliding scale insulin  Arthritis: Mostly in R knee, home meds include diclofenac - Hold NSAIDs due to surgery   Regularly Irregular CV Rhythm: - repeat EKG - telemetry  Dispo: Anticipated discharge in ~2-3 days - SW consult for SNF placement after surgical repair  Code Status: Full code  This is a Psychologist, occupational Note.  The care of the patient was discussed with Dr. Mariea Clonts and Dr. Dimple Casey and the assessment and plan was formulated with their assistance.  Please see their note for official documentation of the patient encounter.   Signed: Azzie Roup, Med Student 2014-12-14, 5:13 PM

## 2014-11-24 NOTE — Progress Notes (Signed)
RN text paged Internal Medicine in relation to patient being on gluco stabilizer. Awaiting call back.

## 2014-11-24 NOTE — Progress Notes (Signed)
Belinda CharterYates, MD returned call. Advised RN to call patients medical MD.

## 2014-11-24 NOTE — Progress Notes (Signed)
Notified by RN that patient could not be awoken.  Rapid response was underway.  She is s/p surgical repair of hip fracture.  She had received Fentanyl 50 mcg IV once in the PACU.  Since that time, she had been mumbling/drowsy. Patient became responsive after administration of second Narcan 0.4 mg.     Filed Vitals:   04-03-15 2046  BP: 111/48  Pulse: 54  Temp: 97.3 F (36.3 C)  Resp:    Gen: elderly female, lying in bed, somnolent CV: RRR Pulm: CTAB. Good air movement throughout frontal lung zones  A/P:  Hold all opioids unless patient alert enough to request medication.  Scheduled tylenol for pain control. Continuous O2 monitoring

## 2014-11-24 NOTE — Transfer of Care (Signed)
Immediate Anesthesia Transfer of Care Note  Patient: Marguerita MerlesHenrietta Heinsohn  Procedure(s) Performed: Procedure(s): INTRAMEDULLARY (IM) NAIL INTERTROCHANTRIC (Left)  Patient Location: PACU  Anesthesia Type:General  Level of Consciousness: sedated  Airway & Oxygen Therapy: Patient Spontanous Breathing and Patient connected to nasal cannula oxygen  Post-op Assessment: Report given to RN and Post -op Vital signs reviewed and stable  Post vital signs: Reviewed and stable  Last Vitals:  Filed Vitals:   July 21, 2014 1738  BP: 113/50  Pulse: 64  Temp:   Resp: 19    Complications: No apparent anesthesia complications

## 2014-11-24 NOTE — Interval H&P Note (Signed)
History and Physical Interval Note:  12/05/2014 3:08 PM  Belinda Newman  has presented today for surgery, with the diagnosis of LEFT IT/ST HIP FRACTURE  The various methods of treatment have been discussed with the patient and family. After consideration of risks, benefits and other options for treatment, the patient has consented to  Procedure(s): INTRAMEDULLARY (IM) NAIL INTERTROCHANTRIC (Left) as a surgical intervention .  The patient's history has been reviewed, patient examined, no change in status, stable for surgery.  I have reviewed the patient's chart and labs.  Questions were answered to the patient's satisfaction.     Luzelena Heeg C

## 2014-11-24 NOTE — Anesthesia Preprocedure Evaluation (Addendum)
Anesthesia Evaluation  Patient identified by MRN, date of birth, ID band Patient awake    Reviewed: Allergy & Precautions, NPO status , Patient's Chart, lab work & pertinent test results  Airway Mallampati: II  TM Distance: >3 FB Neck ROM: Full    Dental  (+) Edentulous Upper, Edentulous Lower, Dental Advisory Given   Pulmonary  breath sounds clear to auscultation        Cardiovascular hypertension, Pt. on medications Rhythm:Regular Rate:Normal     Neuro/Psych    GI/Hepatic   Endo/Other  diabetes, Well Controlled, Type 2  Renal/GU      Musculoskeletal   Abdominal   Peds  Hematology   Anesthesia Other Findings Pt mildly lethargic but answers questions appropriately.  Unsure of meds taken today.  Reproductive/Obstetrics                            Anesthesia Physical Anesthesia Plan  ASA: III and emergent  Anesthesia Plan: General   Post-op Pain Management:    Induction: Intravenous  Airway Management Planned: Oral ETT  Additional Equipment:   Intra-op Plan:   Post-operative Plan: Possible Post-op intubation/ventilation  Informed Consent: I have reviewed the patients History and Physical, chart, labs and discussed the procedure including the risks, benefits and alternatives for the proposed anesthesia with the patient or authorized representative who has indicated his/her understanding and acceptance.   Dental advisory given  Plan Discussed with: CRNA, Anesthesiologist and Surgeon  Anesthesia Plan Comments:         Anesthesia Quick Evaluation

## 2014-11-24 NOTE — H&P (Signed)
Date: 12/13/14               Patient Name:  Belinda Newman MRN: [id number]  DOB: July 20, 1927 Age / Sex: 81 y.o., female   PCP: Marcell Anger, NP         Medical Service: Internal Medicine Teaching Service         Attending Physician: Dr. Josem Kaufmann    First Contact: Dr. Dimple Casey Pager: [phone]  Second Contact: Dr. Mariea Clonts Pager: [phone]       After Hours (After 5p/  First Contact Pager: [phone]  weekends / holidays): Second Contact Pager: [phone]   Chief Complaint: Left Hip Fracture  History of Present Illness: The patient is a 81 y/o AAF with a history of hypertension, diabetes mellitus, and arthritis who presented to the ED via EMS after falling onto her left hip. She fell shortly after 8:00am today 7/10 morning while at home with her grandson. Her fall was not witnessed but was seen within minutes in a seated position, alert and oriented, with severe left hip pain. She reports mis-stepping and falling, with no preceding dizziness, weakness, chest pain, palpitations, headache, or loss of consciousness and states she did not hit her head. She normally walks without assistance, but has previously fallen approximately once per 1-2 months at home without any serious injuries.  EMS arrived shortly afterwards and transported her to the [facility] ED, administering  morphine for pain control. Plain x-ray of the hips demonstrated oblique mildly comminuted fracture of the left femur. She was markedly hypertensive at 246/84 then given  hydralazine IV and 50 mcg fentanyl IV. She became hypotensive and more sedated, which partially reversed after adminsitering 0.4mg  naloxone. Patient was admitted to the medicine service and Orthopedics was consulted to evaluate for surgical repair.  Collateral history provided by the patient's daughter and sister, present at bedside.  Meds: Current Facility-Administered Medications  Medication Dose Route Frequency Provider Last Rate Last Dose  . 0.9 %   sodium chloride infusion   Intravenous Continuous Ejiroghene Wendall Stade, MD      . Mitzi Hansen Hold] fentaNYL (SUBLIMAZE) injection 50 mcg  50 mcg Intravenous Q30 min PRN Pricilla Loveless, MD   50 mcg at 12/13/2014 1039   Facility-Administered Medications Ordered in Other Encounters  Medication Dose Route Frequency Provider Last Rate Last Dose  . 0.9 %  sodium chloride infusion    Continuous PRN Atilano Ina, CRNA        Allergies: Allergies as of 12/13/14  . (No Known Allergies)   Past Medical History  Diagnosis Date  . Diabetes mellitus without complication   . Hypertension   . Arthritis    History reviewed. No pertinent past surgical history. History reviewed. No pertinent family history. History   Social History  . Marital Status: Single    Spouse Name: N/A  . Number of Children: N/A  . Years of Education: N/A   Occupational History  . Not on file.   Social History Main Topics  . Smoking status: Never Smoker   . Smokeless tobacco: Not on file  . Alcohol Use: No  . Drug Use: No  . Sexual Activity: Not on file   Other Topics Concern  . Not on file   Social History Narrative   Lives at home with grandson    Review of Systems: Review of Systems  Constitutional: Negative for fever and malaise/fatigue.  HENT: Negative for tinnitus.   Eyes: Negative for blurred vision, double vision and pain.  Respiratory: Negative for cough, shortness of breath and wheezing.   Cardiovascular: Positive for leg swelling. Negative for chest pain, palpitations, orthopnea and claudication.  Gastrointestinal: Negative for vomiting, abdominal pain, diarrhea, blood in stool and melena.  Genitourinary: Negative for dysuria and flank pain.  Musculoskeletal: Positive for joint pain and falls.  Neurological: Negative for dizziness, focal weakness, seizures, loss of consciousness, weakness and headaches.  Endo/Heme/Allergies: Negative for environmental allergies.  Psychiatric/Behavioral: Negative  for memory loss.    Physical Exam: Blood pressure 105/46, pulse 67, temperature 97.9 F (36.6 C), temperature source Oral, resp. rate 21, height 5\' 6"  (1.676 m), weight 68.04 kg (150 lb), SpO2 92 %.   GENERAL- alert and oriented, co-operative, drowsy HEENT- Atraumatic, PERRL, EOMI, oral mucosa appears moist, edentulous, no carotid bruit, pulsatile jugular vein CARDIAC- Regularly irregular rhythm at normal rate, paired beats, no murmurs, rubs or gallops. RESP- Moving equal volumes of air, and clear to auscultation bilaterally, no wheezes or crackles ABDOMEN- Soft, nontender, no guarding or rebound, no palpable masses or organomegaly NEURO- No obvious Cr N abnormality, Strength upper extremities- 5/5, RLE- 2/5 possibly complicated by- LLE limited by injury, Sensation intact globally EXTREMITIES- Poor peripheral pulses, normal capillary refill b/l, trace pedal edema b/l to mid calf, LLE shortened and externally rotated SKIN- Cool hands, warm, dry, no rash or lesion. PSYCH- Normal mood and affect, appropriate thought content and speech.  Lab results: Basic Metabolic Panel:  Recent Labs  40/98/1107/07/2014 1145  NA 140  K 3.9  CL 107  CO2 24  GLUCOSE 221*  BUN 22*  CREATININE 1.67*  CALCIUM 9.5   CBC:  Recent Labs  11/18/2014 1145  WBC 16.8*  NEUTROABS 15.1*  HGB 10.9*  HCT 33.9*  MCV 85.6  PLT 217   Coagulation:  Recent Labs  11/18/2014 1145  LABPROT 15.0  INR 1.16    Imaging results:  Dg Chest 1 View  11/15/2014   CLINICAL DATA:  Fall in bathroom this morning. Hypertension and diabetes.  EXAM: CHEST  1 VIEW  COMPARISON:  None.  FINDINGS: The heart size and mediastinal contours are within normal limits. Both lungs are clear. Mild elevation of left hemidiaphragm noted. No evidence of pleural effusion.  IMPRESSION: No active disease.   Electronically Signed   By: Myles RosenthalJohn  Stahl M.D.   On: 12/10/2014 11:44   Dg Hip Unilat With Pelvis 2-3 Views Left  11/20/2014   CLINICAL DATA:  Fall  with left hip pain today.  Initial encounter.  EXAM: DG HIP (WITH OR WITHOUT PELVIS) 2-3V LEFT  COMPARISON:  None.  FINDINGS: An oblique mildly comminuted fracture of the proximal left femur is identified extending 15 cm from the greater trochanter inferiorly. 2 cm anterior and lateral displacement is identified.  There is no evidence of subluxation or dislocation.  Mild degenerative changes within both hips are present.  IMPRESSION: Oblique mildly comminuted displaced fracture of the proximal left femur as described.   Electronically Signed   By: Harmon PierJeffrey  Hu M.D.   On: 11/22/2014 11:45    Other results: EKG: Sinus rhythm at 89bpm with LVH, QTc moderately increased at 510, possible IVCD vs BBB. Repeat EKG ordered as rhythm displayed on monitor during physical exam differed significantly from earlier EKG findings.  Assessment & Plan by Problem: Closed left femur fracture: After ground height fall at home. Orthopedic surgery to repair today. Patient will most likely require SNF placement for 24 hour assistance and rehabilitation after surgery. Requires pain control but caution not to  oversedate given her initial presentation. Hemodynamically stable for surgery today, may require support if hypotension recurs. - NPO - Surgical repair this afternoon, IMN - Continue IV NS 131mL/hr - Pain control - Consult SW for SNF placement after successful surgical repair  HTN: Large variation in bp since admission. Initially hypertensive to 246/84 which dropped to 80s/40s after hydralazine, mophine, fentayl. Reversed to 100s with 0.4mg  naloxone and 1L NS fluid bolus. - Hold home antihypertensives at this time  DM2: On metformin at home - Hold metformin - Start SSI on sensitive scale  Arthritis: Primarily of the knees, on diclofenac at home - Hold NSAIDs due to surgery and elevated Cr without known baseline  FULL CODE FEN NPO until after surgery, then advance diet as tolerated DVT ppx start lovenox once safe  from surgical standpoint   Dispo: Disposition is deferred at this time, awaiting improvement of current medical problems and surgical recovery. Anticipated discharge in approximately 2-3 day(s).   The patient does not know have a current PCP (Candice Audrie Lia, NP) and does not know need an [facility] follow-up appointment after discharge.  The patient does not know have transportation limitations that hinder transportation to clinic appointments.  Signed: Fuller Plan, MD 12/15/2014, 3:38 PM

## 2014-11-24 NOTE — ED Notes (Signed)
Phlebotomy at bedside.

## 2014-11-24 NOTE — Progress Notes (Signed)
Kathlen BrunswickNicks, MD called RN back. RN informed MD that patients glucose is 245. MD ordered RN to discontinue insulin drip, implemented sliding scale, and ordered normal saline IV fluids at 75 ccs.

## 2014-11-25 ENCOUNTER — Inpatient Hospital Stay (HOSPITAL_COMMUNITY): Payer: Medicare PPO

## 2014-11-25 ENCOUNTER — Encounter (HOSPITAL_COMMUNITY): Payer: Self-pay | Admitting: Orthopaedic Surgery

## 2014-11-25 DIAGNOSIS — R40241 Glasgow coma scale score 13-15: Secondary | ICD-10-CM

## 2014-11-25 DIAGNOSIS — I998 Other disorder of circulatory system: Secondary | ICD-10-CM

## 2014-11-25 DIAGNOSIS — S72009A Fracture of unspecified part of neck of unspecified femur, initial encounter for closed fracture: Secondary | ICD-10-CM

## 2014-11-25 DIAGNOSIS — S7290XA Unspecified fracture of unspecified femur, initial encounter for closed fracture: Secondary | ICD-10-CM

## 2014-11-25 DIAGNOSIS — R4182 Altered mental status, unspecified: Secondary | ICD-10-CM | POA: Insufficient documentation

## 2014-11-25 DIAGNOSIS — Z452 Encounter for adjustment and management of vascular access device: Secondary | ICD-10-CM | POA: Insufficient documentation

## 2014-11-25 DIAGNOSIS — R4020 Unspecified coma: Secondary | ICD-10-CM

## 2014-11-25 DIAGNOSIS — R401 Stupor: Secondary | ICD-10-CM

## 2014-11-25 DIAGNOSIS — S7292XA Unspecified fracture of left femur, initial encounter for closed fracture: Secondary | ICD-10-CM

## 2014-11-25 LAB — GLUCOSE, CAPILLARY
GLUCOSE-CAPILLARY: 67 mg/dL (ref 65–99)
GLUCOSE-CAPILLARY: 99 mg/dL (ref 65–99)
GLUCOSE-CAPILLARY: 143 mg/dL — AB (ref 65–99)
Glucose-Capillary: 126 mg/dL — ABNORMAL HIGH (ref 65–99)
Glucose-Capillary: 137 mg/dL — ABNORMAL HIGH (ref 65–99)
Glucose-Capillary: 233 mg/dL — ABNORMAL HIGH (ref 65–99)
Glucose-Capillary: 90 mg/dL (ref 65–99)

## 2014-11-25 LAB — BASIC METABOLIC PANEL
ANION GAP: 22 — AB (ref 5–15)
ANION GAP: 23 — AB (ref 5–15)
Anion gap: 18 — ABNORMAL HIGH (ref 5–15)
Anion gap: 27 — ABNORMAL HIGH (ref 5–15)
BUN: 31 mg/dL — ABNORMAL HIGH (ref 6–20)
BUN: 31 mg/dL — ABNORMAL HIGH (ref 6–20)
BUN: 32 mg/dL — AB (ref 6–20)
BUN: 33 mg/dL — ABNORMAL HIGH (ref 6–20)
CALCIUM: 8.2 mg/dL — AB (ref 8.9–10.3)
CALCIUM: 8 mg/dL — AB (ref 8.9–10.3)
CHLORIDE: 108 mmol/L (ref 101–111)
CO2: 9 mmol/L — ABNORMAL LOW (ref 22–32)
CO2: 11 mmol/L — ABNORMAL LOW (ref 22–32)
CO2: 7 mmol/L — ABNORMAL LOW (ref 22–32)
CO2: 7 mmol/L — ABNORMAL LOW (ref 22–32)
CREATININE: 3.21 mg/dL — AB (ref 0.44–1.00)
Calcium: 8.2 mg/dL — ABNORMAL LOW (ref 8.9–10.3)
Calcium: 8.5 mg/dL — ABNORMAL LOW (ref 8.9–10.3)
Chloride: 110 mmol/L (ref 101–111)
Chloride: 118 mmol/L — ABNORMAL HIGH (ref 101–111)
Chloride: 116 mmol/L — ABNORMAL HIGH (ref 101–111)
Creatinine, Ser: 3.1 mg/dL — ABNORMAL HIGH (ref 0.44–1.00)
Creatinine, Ser: 3.11 mg/dL — ABNORMAL HIGH (ref 0.44–1.00)
Creatinine, Ser: 3.55 mg/dL — ABNORMAL HIGH (ref 0.44–1.00)
GFR calc Af Amer: 15 mL/min — ABNORMAL LOW (ref >60)
GFR calc Af Amer: 14 mL/min — ABNORMAL LOW (ref >60)
GFR calc non Af Amer: 13 mL/min — ABNORMAL LOW (ref >60)
GFR calc non Af Amer: 13 mL/min — ABNORMAL LOW (ref >60)
GFR calc non Af Amer: 12 mL/min — ABNORMAL LOW (ref >60)
GFR, EST AFRICAN AMERICAN: 15 mL/min — AB (ref >60)
GFR, EST AFRICAN AMERICAN: 13 mL/min — AB (ref >60)
GFR, EST NON AFRICAN AMERICAN: 11 mL/min — AB (ref >60)
GLUCOSE: 142 mg/dL — AB (ref 65–99)
GLUCOSE: 146 mg/dL — AB (ref 65–99)
Glucose, Bld: 92 mg/dL (ref 65–99)
Glucose, Bld: 215 mg/dL — ABNORMAL HIGH (ref 65–99)
POTASSIUM: 4.8 mmol/L (ref 3.5–5.1)
Potassium: 5.3 mmol/L — ABNORMAL HIGH (ref 3.5–5.1)
Potassium: 5 mmol/L (ref 3.5–5.1)
Potassium: 5.4 mmol/L — ABNORMAL HIGH (ref 3.5–5.1)
SODIUM: 146 mmol/L — AB (ref 135–145)
Sodium: 141 mmol/L (ref 135–145)
Sodium: 147 mmol/L — ABNORMAL HIGH (ref 135–145)
Sodium: 142 mmol/L (ref 135–145)

## 2014-11-25 LAB — URINALYSIS, ROUTINE W REFLEX MICROSCOPIC
Bilirubin Urine: NEGATIVE
GLUCOSE, UA: 100 mg/dL — AB
Ketones, ur: 15 mg/dL — AB
Nitrite: NEGATIVE
Protein, ur: >300 mg/dL — AB
Urobilinogen, UA: 1 mg/dL (ref 0.0–1.0)
pH: 5 (ref 5.0–8.0)

## 2014-11-25 LAB — BLOOD GAS, ARTERIAL
Acid-base deficit: 23 mmol/L — ABNORMAL HIGH (ref 0.0–2.0)
Bicarbonate: 4.4 mEq/L — ABNORMAL LOW (ref 20.0–24.0)
Drawn by: 22766
O2 CONTENT: 2 L/min
O2 Saturation: 98.2 %
Patient temperature: 98.6
TCO2: 4.8 mmol/L (ref 0–100)
pCO2 arterial: 12.4 mmHg — CL (ref 35.0–45.0)
pH, Arterial: 7.177 — CL (ref 7.350–7.450)
pO2, Arterial: 145 mmHg — ABNORMAL HIGH (ref 80.0–100.0)

## 2014-11-25 LAB — CBC WITH DIFFERENTIAL/PLATELET
Basophils Absolute: 0 10*3/uL (ref 0.0–0.1)
Basophils Relative: 0 % (ref 0–1)
EOS ABS: 0 10*3/uL (ref 0.0–0.7)
EOS PCT: 0 % (ref 0–5)
HCT: 22.9 % — ABNORMAL LOW (ref 36.0–46.0)
Hemoglobin: 7.2 g/dL — ABNORMAL LOW (ref 12.0–15.0)
LYMPHS PCT: 12 % (ref 12–46)
Lymphs Abs: 1.7 10*3/uL (ref 0.7–4.0)
MCH: 27.8 pg (ref 26.0–34.0)
MCHC: 31.4 g/dL (ref 30.0–36.0)
MCV: 88.4 fL (ref 78.0–100.0)
MONO ABS: 0.9 10*3/uL (ref 0.1–1.0)
MONOS PCT: 6 % (ref 3–12)
Neutro Abs: 11.6 10*3/uL — ABNORMAL HIGH (ref 1.7–7.7)
Neutrophils Relative %: 82 % — ABNORMAL HIGH (ref 43–77)
Platelets: 99 10*3/uL — ABNORMAL LOW (ref 150–400)
RBC: 2.59 MIL/uL — ABNORMAL LOW (ref 3.87–5.11)
RDW: 13.9 % (ref 11.5–15.5)
WBC: 14.2 10*3/uL — ABNORMAL HIGH (ref 4.0–10.5)

## 2014-11-25 LAB — TROPONIN I
Troponin I: 8.42 ng/mL (ref <0.031)
Troponin I: 13.75 ng/mL (ref <0.031)

## 2014-11-25 LAB — POCT I-STAT 3, ART BLOOD GAS (G3+)
ACID-BASE DEFICIT: 16 mmol/L — AB (ref 0.0–2.0)
ACID-BASE DEFICIT: 22 mmol/L — AB (ref 0.0–2.0)
Bicarbonate: 9.9 mEq/L — ABNORMAL LOW (ref 20.0–24.0)
Bicarbonate: 5.8 mEq/L — ABNORMAL LOW (ref 20.0–24.0)
O2 SAT: 74 %
O2 Saturation: 100 %
PCO2 ART: 20.4 mmHg — AB (ref 35.0–45.0)
PH ART: 7.254 — AB (ref 7.350–7.450)
Patient temperature: 99
TCO2: 11 mmol/L (ref 0–100)
TCO2: 6 mmol/L (ref 0–100)
pCO2 arterial: 22.4 mmHg — ABNORMAL LOW (ref 35.0–45.0)
pH, Arterial: 7.062 — CL (ref 7.350–7.450)
pO2, Arterial: 450 mmHg — ABNORMAL HIGH (ref 80.0–100.0)
pO2, Arterial: 54 mmHg — ABNORMAL LOW (ref 80.0–100.0)

## 2014-11-25 LAB — CBC
HCT: 30.2 % — ABNORMAL LOW (ref 36.0–46.0)
HCT: 22.4 % — ABNORMAL LOW (ref 36.0–46.0)
HCT: 34.6 % — ABNORMAL LOW (ref 36.0–46.0)
HEMOGLOBIN: 7.1 g/dL — AB (ref 12.0–15.0)
Hemoglobin: 9.6 g/dL — ABNORMAL LOW (ref 12.0–15.0)
Hemoglobin: 10.9 g/dL — ABNORMAL LOW (ref 12.0–15.0)
MCH: 27.9 pg (ref 26.0–34.0)
MCH: 28 pg (ref 26.0–34.0)
MCH: 27.7 pg (ref 26.0–34.0)
MCHC: 31.8 g/dL (ref 30.0–36.0)
MCHC: 31.7 g/dL (ref 30.0–36.0)
MCHC: 31.5 g/dL (ref 30.0–36.0)
MCV: 87.8 fL (ref 78.0–100.0)
MCV: 88.2 fL (ref 78.0–100.0)
MCV: 88 fL (ref 78.0–100.0)
PLATELETS: 91 10*3/uL — AB (ref 150–400)
PLATELETS: 77 10*3/uL — AB (ref 150–400)
Platelets: 137 10*3/uL — ABNORMAL LOW (ref 150–400)
RBC: 3.44 MIL/uL — ABNORMAL LOW (ref 3.87–5.11)
RBC: 2.54 MIL/uL — AB (ref 3.87–5.11)
RBC: 3.93 MIL/uL (ref 3.87–5.11)
RDW: 13.8 % (ref 11.5–15.5)
RDW: 13.7 % (ref 11.5–15.5)
RDW: 14.3 % (ref 11.5–15.5)
WBC: 15.4 10*3/uL — ABNORMAL HIGH (ref 4.0–10.5)
WBC: 13 10*3/uL — AB (ref 4.0–10.5)
WBC: 16.8 10*3/uL — AB (ref 4.0–10.5)

## 2014-11-25 LAB — HEPATIC FUNCTION PANEL
ALBUMIN: 2.3 g/dL — AB (ref 3.5–5.0)
ALT: 3075 U/L — ABNORMAL HIGH (ref 14–54)
AST: 2486 U/L — AB (ref 15–41)
Alkaline Phosphatase: 36 U/L — ABNORMAL LOW (ref 38–126)
BILIRUBIN DIRECT: 0.8 mg/dL — AB (ref 0.1–0.5)
Indirect Bilirubin: 0.8 mg/dL (ref 0.3–0.9)
TOTAL PROTEIN: 4.5 g/dL — AB (ref 6.5–8.1)
Total Bilirubin: 1.6 mg/dL — ABNORMAL HIGH (ref 0.3–1.2)

## 2014-11-25 LAB — URINE MICROSCOPIC-ADD ON

## 2014-11-25 LAB — ABO/RH: ABO/RH(D): B POS

## 2014-11-25 LAB — PROTIME-INR
INR: 4.42 — ABNORMAL HIGH (ref 0.00–1.49)
Prothrombin Time: 41 seconds — ABNORMAL HIGH (ref 11.6–15.2)

## 2014-11-25 LAB — CK: Total CK: 719 U/L — ABNORMAL HIGH (ref 38–234)

## 2014-11-25 LAB — MRSA PCR SCREENING: MRSA by PCR: NEGATIVE

## 2014-11-25 LAB — LACTIC ACID, PLASMA
LACTIC ACID, VENOUS: 12.7 mmol/L — AB (ref 0.5–2.0)
Lactic Acid, Venous: 15.4 mmol/L (ref 0.5–2.0)

## 2014-11-25 LAB — PREPARE RBC (CROSSMATCH)

## 2014-11-25 LAB — TSH: TSH: 1.553 u[IU]/mL (ref 0.350–4.500)

## 2014-11-25 LAB — PROCALCITONIN: Procalcitonin: 2.19 ng/mL

## 2014-11-25 LAB — LACTATE DEHYDROGENASE: LDH: 3829 U/L — AB (ref 98–192)

## 2014-11-25 LAB — APTT: APTT: 64 s — AB (ref 24–37)

## 2014-11-25 MED ORDER — NALOXONE HCL 0.4 MG/ML IJ SOLN
INTRAMUSCULAR | Status: AC
  Filled 2014-11-25: qty 1

## 2014-11-25 MED ORDER — CEFTRIAXONE SODIUM IN DEXTROSE 40 MG/ML IV SOLN
2.0000 g | INTRAVENOUS | Status: DC
  Administered 2014-11-25: 2 g via INTRAVENOUS
  Filled 2014-11-25: qty 50

## 2014-11-25 MED ORDER — SODIUM CHLORIDE 0.9 % IV BOLUS (SEPSIS): 250.0000 mL | Freq: Once | INTRAVENOUS | Status: DC

## 2014-11-25 MED ORDER — DOCUSATE SODIUM 50 MG/5ML PO LIQD: 100.0000 mg | Freq: Two times a day (BID) | ORAL | Status: DC | PRN

## 2014-11-25 MED ORDER — DOPAMINE-DEXTROSE 3.2-5 MG/ML-% IV SOLN
0.0000 ug/kg/min | INTRAVENOUS | Status: DC
  Administered 2014-11-25: 5 ug/kg/min via INTRAVENOUS

## 2014-11-25 MED ORDER — SODIUM BICARBONATE 8.4 % IV SOLN
INTRAVENOUS | Status: DC
Start: 2014-11-25 — End: 2014-11-26
  Administered 2014-11-25: 10:00:00 via INTRAVENOUS
  Filled 2014-11-25 (×4): qty 150

## 2014-11-25 MED ORDER — ATROPINE SULFATE 1 % OP SOLN
2.0000 [drp] | Freq: Four times a day (QID) | OPHTHALMIC | Status: DC | PRN
  Filled 2014-11-25: qty 2

## 2014-11-25 MED ORDER — DEXTROSE 50 % IV SOLN
INTRAVENOUS | Status: AC
  Filled 2014-11-25: qty 50

## 2014-11-25 MED ORDER — GLUCAGON HCL RDNA (DIAGNOSTIC) 1 MG IJ SOLR
INTRAMUSCULAR | Status: AC
  Filled 2014-11-25: qty 1

## 2014-11-25 MED ORDER — MIDAZOLAM HCL 2 MG/2ML IJ SOLN
1.0000 mg | Freq: Once | INTRAMUSCULAR | Status: AC
  Administered 2014-11-25: 1 mg via INTRAVENOUS

## 2014-11-25 MED ORDER — MORPHINE BOLUS VIA INFUSION
5.0000 mg | INTRAVENOUS | Status: DC | PRN
  Filled 2014-11-25: qty 20

## 2014-11-25 MED ORDER — CHLORHEXIDINE GLUCONATE 0.12 % MT SOLN: 15.0000 mL | Freq: Two times a day (BID) | OROMUCOSAL | Status: DC

## 2014-11-25 MED ORDER — FENTANYL CITRATE (PF) 100 MCG/2ML IJ SOLN
INTRAMUSCULAR | Status: AC
  Filled 2014-11-25: qty 4

## 2014-11-25 MED ORDER — ATROPINE SULFATE 0.1 MG/ML IJ SOLN
INTRAMUSCULAR | Status: AC
  Administered 2014-11-25: 1 mg
  Filled 2014-11-25: qty 10

## 2014-11-25 MED ORDER — FENTANYL CITRATE (PF) 100 MCG/2ML IJ SOLN: 50.0000 ug | INTRAMUSCULAR | Status: DC | PRN

## 2014-11-25 MED ORDER — DOPAMINE-DEXTROSE 3.2-5 MG/ML-% IV SOLN
INTRAVENOUS | Status: AC
  Filled 2014-11-25: qty 250

## 2014-11-25 MED ORDER — DEXTROSE 50 % IV SOLN
50.0000 mL | Freq: Once | INTRAVENOUS | Status: AC
  Administered 2014-11-25: 50 mL via INTRAVENOUS

## 2014-11-25 MED ORDER — SODIUM POLYSTYRENE SULFONATE 15 GM/60ML PO SUSP
30.0000 g | Freq: Once | ORAL | Status: DC
  Filled 2014-11-25: qty 120

## 2014-11-25 MED ORDER — MIDAZOLAM HCL 2 MG/2ML IJ SOLN
INTRAMUSCULAR | Status: AC
  Administered 2014-11-25: 1 mg via INTRAVENOUS
  Filled 2014-11-25: qty 4

## 2014-11-25 MED ORDER — SODIUM CHLORIDE 0.9 % IV BOLUS (SEPSIS)
500.0000 mL | Freq: Once | INTRAVENOUS | Status: AC
  Administered 2014-11-25: 500 mL via INTRAVENOUS

## 2014-11-25 MED ORDER — PANTOPRAZOLE SODIUM 40 MG IV SOLR
40.0000 mg | INTRAVENOUS | Status: DC
  Administered 2014-11-25: 40 mg via INTRAVENOUS
  Filled 2014-11-25: qty 40

## 2014-11-25 MED ORDER — MORPHINE SULFATE 25 MG/ML IV SOLN
10.0000 mg/h | INTRAVENOUS | Status: DC
  Administered 2014-11-25: 5 mg/h via INTRAVENOUS
  Filled 2014-11-25: qty 10

## 2014-11-25 MED ORDER — SODIUM BICARBONATE 8.4 % IV SOLN
INTRAVENOUS | Status: AC
  Administered 2014-11-25: 09:00:00
  Filled 2014-11-25: qty 100

## 2014-11-25 MED ORDER — CETYLPYRIDINIUM CHLORIDE 0.05 % MT LIQD
7.0000 mL | Freq: Four times a day (QID) | OROMUCOSAL | Status: DC
  Administered 2014-11-25: 7 mL via OROMUCOSAL

## 2014-11-25 MED ORDER — NALOXONE HCL 0.4 MG/ML IJ SOLN
INTRAMUSCULAR | Status: AC
  Administered 2014-11-25: 0.4 mg
  Administered 2014-11-25 (×2): 0.4 mg/mL
  Filled 2014-11-25: qty 1

## 2014-11-25 MED ORDER — SODIUM CHLORIDE 0.9 % IV SOLN: Freq: Once | INTRAVENOUS | Status: DC

## 2014-11-25 MED ORDER — CALCIUM CHLORIDE 10 % IV SOLN
1.0000 g | Freq: Once | INTRAVENOUS | Status: AC
Start: 2014-11-25 — End: 2014-11-25
  Administered 2014-11-25: 1 g via INTRAVENOUS
  Filled 2014-11-25: qty 10

## 2014-11-25 MED ORDER — NOREPINEPHRINE BITARTRATE 1 MG/ML IV SOLN
2.0000 ug/min | INTRAVENOUS | Status: DC
  Administered 2014-11-25: 10 ug/min via INTRAVENOUS
  Administered 2014-11-25: 8 ug/min via INTRAVENOUS
  Filled 2014-11-25: qty 4

## 2014-11-25 MED ORDER — SCOPOLAMINE 1 MG/3DAYS TD PT72
1.0000 | MEDICATED_PATCH | TRANSDERMAL | Status: DC
  Filled 2014-11-25: qty 1

## 2014-11-26 LAB — TYPE AND SCREEN
ABO/RH(D): B POS
ANTIBODY SCREEN: NEGATIVE
Unit division: 0
Unit division: 0

## 2014-11-26 MED FILL — Naloxone HCl Inj 0.4 MG/ML: INTRAMUSCULAR | Qty: 2 | Status: AC

## 2014-11-29 NOTE — Discharge Summary (Signed)
NAMJaclyn Prime:  Gerstner, Austina          ACCOUNT NO.:  [id number]  MEDICAL RECORD NO.:  [id number]  LOCATION:  2M01C                        FACILITY:  [facility]  PHYSICIAN:  Nelda Bucksaniel J Joncarlo Friberg, MD DATE OF BIRTH:  30-Dec-1927  DATE OF ADMISSION:  11-01-2014 DATE OF DISCHARGE:  11/20/2014                              DISCHARGE SUMMARY   DEATH SUMMARY  This is an 81 year old African American female with history of diabetes, hypertension, likely undiagnosed peripheral vascular disease, followed by Internal Medicine Service, admitted on November 24, 2014, secondary to a fall with a hip fracture on the left.  She was taken to the operating room by Orthopedic Surgery on November 24, 2014, and repaired the left hip with pin placement.  Postop, she was lethargic.  She improved with some Narcan.  On November 25, 2014, she was found to have worsening left knee, not responsive to Narcan, severe metabolic acidosis and essentially multiorgan failure, was brought to the intensive care unit and required intubation, central line placement and aggressive resuscitation.  CT scan of the head also was done on November 25, 2014, showed negative for acute process of significant events including the November 24, 2014, operating room trip with pinning of the left hip.  On November 25, 2014, unresponsive status, metabolic acidosis and apparently hypotension the night prior to admission to the intensive care unit with a likely hypoperfusion state, again required emergent intubation.  The patient was found to have in the intensive care unit despite aggressive fluid resuscitation, lack of pulses bilaterally, did have popliteal pulses. So, Vascular Surgery was consulted and felt like that this was not the primary driving force of her multiorgan failure.  Family was notified, they were the bedside, they made her a do not resuscitate and essentially, she was dying of multiorgan failure, continued shock state despite aggressive fluid resuscitation.   The patient had acute kidney injury as well with her anion gap acidosis.  Given lack of options, lengthy discussion was had with the family continued to have significant vasopressor support and acidosis with minimal worsening urine output. The patient was planned to have comfort care.  Comfort care was provided and the patient expired.  FINAL DIAGNOSES UPON DEATH: 1. Likely profound hypoperfused state. 2. Shock secondary to likely hypovolemia associated with anemia, acute     respiratory failure. 3. Uncompensated metabolic acidosis. 4. Ischemic bilateral lower extremities likely secondary to her     overlying hypoperfused state. 5. Multiorgan dysfunction syndrome. 6. Troponin elevation secondary to likely rhabdomyolysis of the lower     extremities and other organs. 7. Status post hip pinning secondary to fall from fracture.     Nelda Bucksaniel J Jaelah Hauth, MD    DJF/MEDQ  D:  11/28/2014  T:  11/29/2014  Job:  [id number]

## 2014-11-30 LAB — CULTURE, BLOOD (ROUTINE X 2)
CULTURE: NO GROWTH
Culture: NO GROWTH

## 2014-12-16 NOTE — Progress Notes (Signed)
Handoff from Lehman BrothersWes RN - patient obtunded - no significant response other than some moaning to Narcan doses  - skin cool and dry - pale - resps shallow regular unlabored - bil BS clear - abd soft - left hip surgical site DDI without signs hematoma - foley patent - no urine output during night per staff - bladder scan reveals no urine - stat labs have been sent  JR noted on monitor - NS wide open started patient hypotensive 100/51 - rectal temp 98.5 - labs noted from earlier this AM creatinine rising - 12 lead EKG done - JR - different from SR noted on admission 12 lead - concerned about hyperkalemia and acidosis - stat ABG ordered - labs pending -  patient has been for CT brain - no acute abnormality - stat CBG done - 67 treated with amp D50W.  Stat call to MD by Amy RN - requested for MD to see patient stat.  Dr. Yetta BarreJones and staff present. Continue with NS - stat call to PCCM for ICU transfer.  BP 112/54  HR 54 RR 12 O2 sats 99% on 2 liter nasal cannula.   Dr. Tyson AliasFeinstein present - stat transfer to ICU 2M01. Patient intubated on arrival - assisted staff - handoff to [employer] RN.  Daughter updated by Dr. Tyson AliasFeinstein.

## 2014-12-16 NOTE — Procedures (Signed)
Limited echo  1. LF cavity small, mid cav graident, - hypovolemic 2. Small pericardial effusion 3. Rt heart kinetic, not sig dilated  Images moderate to po on vent  Hypovolemic - give fluids  Mcarthur Rossettianiel J. Tyson AliasFeinstein, MD, FACP Pgr: [phone] [facility]

## 2014-12-16 NOTE — Progress Notes (Addendum)
CRITICAL VALUE ALERT  Critical value received: Lactic acid 12.7   Date of notification:  12/12/2014  Time of notification:  11:01  Critical value read back:Yes.    Nurse who received alert:  Arnoldo LenisAlex Huntley Knoop RN  MD notified (1st page):  Dr. Tyson AliasFeinstein  Time of first page:  11:01     Responding MD:  Dr. Tyson AliasFeinstein  Time MD responded:  11:01

## 2014-12-16 NOTE — Progress Notes (Signed)
Patient ID: Belinda MerlesHenrietta Newman, female   DOB: 1927/10/05, 81 y.o.   MRN: [id number]  Trop 8, a picture of hypoperfusion / hypotension in setting PVD? I have had extensive discussions with family duaghter. We discussed patients current circumstances and organ failures. We also discussed patient's prior wishes under circumstances such as this. Family has decided to NOT perform resuscitation if arrest but to continue current medical support for now. Will notify surgeon  Echo needed Give PRBC STAT, hct 22 Max MV, add levo Use dop at  Beta affect 8 Daughter updatd multiple times Repeat ecg  A picture of hypoperfusion organs Poor prognosis likely Place a line radials  Mcarthur RossettiDaniel J. Tyson AliasFeinstein, MD, FACP Pgr: [phone] [facility]

## 2014-12-16 NOTE — Progress Notes (Signed)
Physical Therapy Treatment Patient Details Name: Marguerita MerlesHenrietta Frenette MRN: [id number] DOB: [birth year] Today's Date: Feb 04, 2015    History of Present Illness Patient fell in the bathroom and diagnosed with Lt femur fracture now s/p trochanteric nail    PT Comments    Patient unable to be seen, has been transferred to ICU for further medical care. Will resume as appropriate.     Time: [id number] PT Time Calculation (min) (ACUTE ONLY): 2 min  Charges:                       G Codes:      Christiane HaBenjamin J. Tailyn Hantz, PT, CSCS Pager [phone] Office [phone]  Feb 04, 2015, 11:21 AM

## 2014-12-16 NOTE — Procedures (Signed)
Intubation Procedure Note Marguerita MerlesHenrietta Krizan [id number] 06/14/1927  Procedure: Intubation Indications: Respiratory insufficiency  Procedure Details Consent: Risks of procedure as well as the alternatives and risks of each were explained to the (patient/caregiver).  Consent for procedure obtained. Time Out: Verified patient identification, verified procedure, site/side was marked, verified correct patient position, special equipment/implants available, medications/allergies/relevent history reviewed, required imaging and test results available.  Performed  Maximum sterile technique was used including cap, gloves, gown and hand hygiene.  MAC and 3    Evaluation Hemodynamic Status: BP stable throughout; O2 sats: stable throughout Patient's Current Condition: stable Complications: No apparent complications Patient did tolerate procedure well. Chest X-ray ordered to verify placement.  CXR: pending.   Nelda BucksFEINSTEIN,DANIEL J. 2014/09/06

## 2014-12-16 NOTE — Progress Notes (Signed)
Terminal extubation done.   Patient passed away during extubation.  Family was present at bedside and RN was present at bedside.  No complications noted.

## 2014-12-16 NOTE — Social Work (Deleted)
Chaplain was called when family arrived...escorted them to the family room for consultation with MD.  Present were:  Wife, and step-son. Comfort measures were turned down repeatedly.  Chaplain offered spiritual presence and listening. Family preferred to call other family members to come to hospital before going to view patient.  Eventually (6:45 pm) all family members were gathered, and I escorted them to C26 for viewing the patient.    Rev. Audie BoxJan Hill Chaplain [phone]

## 2014-12-16 NOTE — Progress Notes (Signed)
CRITICAL VALUE ALERT  Critical value received: Lactic Acid  Date of notification:  07/06/14  Time of notification:  18:17  Critical value read back:Yes.    Nurse who received alert:  Arnoldo LenisAlex Laurell Coalson Rn  MD notified (1st page):  Dr. Gilford RaidNesser  Time of first page:  18:27   Responding MD:  Dr. Gilford RaidNesser  Time MD responded: 18:27

## 2014-12-16 NOTE — Progress Notes (Signed)
250 ml of morphine wasted in sink with Lasandra BeechAyana Smith, RN.

## 2014-12-16 NOTE — Progress Notes (Addendum)
CRITICAL VALUE ALERT  Critical value received:  Troponin 8.42  Date of notification:  03-15-15  Time of notification:  0935  Critical value read back:Yes.    Nurse who received alert:  Arnoldo LenisAlex Shaunda Tipping RN  MD notified (1st page):  Dr. Tyson AliasFeinstein  Time of first page:  0935   Responding MD:  Dr. Tyson AliasFeinstein  Time MD responded:  [phone]

## 2014-12-16 NOTE — Care Management Note (Signed)
Case Management Note  Patient Details  Name: Belinda Newman MRN: [id number] Date of Birth: [birth year]  Subjective/Objective:     S/p hip fx, resp distress-vent               Action/Plan:lives w fam, pcp dr Claris Gladdencandace clark   Expected Discharge Date:                  Expected Discharge Plan:     In-House Referral:     Discharge planning Services     Post Acute Care Choice:    Choice offered to:     DME Arranged:    DME Agency:     HH Arranged:    HH Agency:     Status of Service:     Medicare Important Message Given:    Date Medicare IM Given:    Medicare IM give by:    Date Additional Medicare IM Given:    Additional Medicare Important Message give by:     If discussed at Long Length of Stay Meetings, dates discussed:    Additional Comments: ur review done  Hanley HaysDowell, Sarahmarie Leavey T, RN 11/16/2014, 9:45 AM

## 2014-12-16 NOTE — Significant Event (Signed)
Rapid Response Event Note  Overview: Time Called: 0610 Arrival Time: 0612 Event Type: Neurologic  Initial Focused Assessment: Patient is again unresponsive to verbal and pain stimuli. Airway is patient. Heart sound regular bradycardic at 57. Skin is warm but cool to  extremities.    Interventions:Ct of head. [facility] Labs were drawn and sent.    Event Summary:Patient is again unresponsive. Pupils are 2mm and reactive. Patient was given Narcan 0.4mg  ivp. Very minimal response noted from Narcan. IV in left hand noted to have infiltrate. Provider is also at bedside.   Name of Physician Notified: Dr. Venia MinksNicholas Taylor at 2045    at 2045  Outcome: Stayed in room and stabalized  Event End Time: 2115  Brynda GreathouseHarbison, Lee Lamond

## 2014-12-16 NOTE — Consult Note (Signed)
PULMONARY / CRITICAL CARE MEDICINE   Name: Belinda Newman MRN: [id number] DOB: 1928/02/12    ADMISSION DATE:  12/14/2014 CONSULTATION DATE:  7/11  REFERRING MD :  Danise Edge   CHIEF COMPLAINT:  Respiratory failure   INITIAL PRESENTATION: 81yo female with hx DM, HTN admitted 7/10 by IMTS after a fall and L hip fx.  She was taken evening 7/10 to OR for repair.  Post op was lethargic, improved with narcan.  On am 7/11 had worsening lethargy not responsive to narcan with severe metabolic acidosis and PCCM consulted for ICU tx and urgent intubation.   STUDIES:  CT head 7/11>>>neg acute.  Old small R cerebellar infarct.   SIGNIFICANT EVENTS: 7/10>> OR - pinning L hip  7/11>> unresponsive, metabolic acidosis, tx ICU, stat intubation    HISTORY OF PRESENT ILLNESS:  81yo female with hx DM, HTN admitted 7/10 by IMTS after a fall and L hip fx.  She was taken evening 7/10 to OR for repair.  Post op was lethargic, improved with narcan.  On am 7/11 had worsening lethargy not responsive to narcan with severe metabolic acidosis and PCCM consulted for ICU tx and urgent intubation.    PAST MEDICAL HISTORY :   has a past medical history of Diabetes mellitus without complication; Hypertension; and Arthritis.  has no past surgical history on file. Prior to Admission medications   Medication Sig Start Date End Date Taking? Authorizing Provider  aspirin EC 81 MG tablet Take 81 mg by mouth daily.   Yes Historical Provider, MD  benazepril-hydrochlorthiazide (LOTENSIN HCT) 20-25 MG per tablet Take 1 tablet by mouth daily.   Yes Historical Provider, MD  chlorpheniramine (CHLOR-TRIMETON) 4 MG tablet Take 4 mg by mouth daily as needed for allergies.   Yes Historical Provider, MD  diclofenac (VOLTAREN) 50 MG EC tablet Take 50 mg by mouth 2 (two) times daily.    Historical Provider, MD  Diclofenac Sodium CR 100 MG 24 hr tablet Take 100 mg by mouth daily. 11/01/14  Yes Historical Provider, MD  loratadine (CLARITIN)  10 MG tablet Take 10 mg by mouth daily as needed for allergies.   Yes Historical Provider, MD  metFORMIN (GLUCOPHAGE) 1000 MG tablet Take 1,000 mg by mouth daily with breakfast.  10/21/14  Yes Historical Provider, MD  ReliOn Ultra Thin Lancets MISC by Does not apply route.    Historical Provider, MD   No Known Allergies  FAMILY HISTORY:  has no family status information on file.  i asked daughter, neg reported SOCIAL HISTORY:  reports that she has never smoked. She does not have any smokeless tobacco history on file. She reports that she does not drink alcohol or use illicit drugs.  REVIEW OF SYSTEMS:  Unable.  unresponsive  SUBJECTIVE:  Some moaning  VITAL SIGNS: Temp:  [97.3 F (36.3 C)-98.2 F (36.8 C)] 98 F (36.7 C) (07/11 0559) Pulse Rate:  [50-90] 61 (07/11 0859) Resp:  [13-26] 23 (07/11 0859) BP: (72-246)/(37-115) 113/67 mmHg (07/11 0859) SpO2:  [91 %-100 %] 100 % (07/11 0859) FiO2 (%):  [1 %-100 %] 100 % (07/11 0859) Weight:  [150 lb (68.04 kg)] 150 lb (68.04 kg) (07/10 1019) HEMODYNAMICS:   VENTILATOR SETTINGS: Vent Mode:  [-] PRVC FiO2 (%):  [1 %-100 %] 100 % Set Rate:  [26 bmp] 26 bmp Vt Set:  [480 mL] 480 mL PEEP:  [5 cmH20] 5 cmH20 Plateau Pressure:  [15 cmH20] 15 cmH20 INTAKE / OUTPUT:  Intake/Output Summary (Last 24 hours) at 2014/11/28  1610 Last data filed at 2014/12/19 1800  Gross per 24 hour  Intake    700 ml  Output    100 ml  Net    600 ml    PHYSICAL EXAMINATION: General:  Frail, elderly female, obtunded  Neuro:  Obtunded, minimal response, pupils 3mm, sluggish  HEENT:  Mm dry, no JVD Cardiovascular:  S1s2, wide qrs, brady  Lungs:  resps even, shallow, diminished bases otherwise ess clear  abdomen:  Soft, non tender, no hsm Musculoskeletal:  Warm and dry, pale, no sig edema L hip dressing small stain    LABS:  CBC  Recent Labs Lab 12/19/14 1145 12/19/2014 1608 2014/12/19 2020 11/23/2014 0634  WBC 16.8*  --  12.0* 15.4*  HGB 10.9* 10.2* 8.8*  9.6*  HCT 33.9* 30.0* 28.3* 30.2*  PLT 217  --  210 137*   Coag's  Recent Labs Lab 12-19-14 1145  INR 1.16   BMET  Recent Labs Lab 12-19-2014 1145 12/19/14 1608 19-Dec-2014 2020 11/26/2014 0634  NA 140 143  --  141  K 3.9 4.5  --  5.3*  CL 107  --   --  110  CO2 24  --   --  9*  BUN 22*  --   --  31*  CREATININE 1.67*  --  2.34* 3.10*  GLUCOSE 221* 349*  --  92   Electrolytes  Recent Labs Lab 12/19/2014 1145 12/01/2014 0634  CALCIUM 9.5 8.2*   Sepsis Markers No results for input(s): LATICACIDVEN, PROCALCITON, O2SATVEN in the last 168 hours. ABG  Recent Labs Lab 12/02/2014 0801  PHART 7.177*  PCO2ART 12.4*  PO2ART 145*   Liver Enzymes No results for input(s): AST, ALT, ALKPHOS, BILITOT, ALBUMIN in the last 168 hours. Cardiac Enzymes No results for input(s): TROPONINI, PROBNP in the last 168 hours. Glucose  Recent Labs Lab December 19, 2014 2044 12-19-2014 2114 December 19, 2014 2358 11/23/2014 0420 12/10/2014 0742 11/30/2014 0803  GLUCAP 243* 202* 233* 126* 67 90    Imaging Dg Chest 1 View  19-Dec-2014   CLINICAL DATA:  Fall in bathroom this morning. Hypertension and diabetes.  EXAM: CHEST  1 VIEW  COMPARISON:  None.  FINDINGS: The heart size and mediastinal contours are within normal limits. Both lungs are clear. Mild elevation of left hemidiaphragm noted. No evidence of pleural effusion.  IMPRESSION: No active disease.   Electronically Signed   By: Myles Rosenthal M.D.   On: 12-19-14 11:44   Ct Head Wo Contrast  12/12/2014   CLINICAL DATA:  Lethargy, unresponsive. Status post LEFT hip ORIF 2014-12-19. History of diabetes and hypertension.  EXAM: CT HEAD WITHOUT CONTRAST  TECHNIQUE: Contiguous axial images were obtained from the base of the skull through the vertex without intravenous contrast.  COMPARISON:  None available for comparison at time of study interpretation.  FINDINGS: The ventricles and sulci are normal for age. No intraparenchymal hemorrhage, mass effect nor midline shift.  Patchy to confluent supratentorial white matter hypodensities are within normal range for patient's age and though non-specific suggest sequelae of chronic small vessel ischemic disease. No acute large vascular territory infarcts. Old small RIGHT inferior cerebellar infarct.  No abnormal extra-axial fluid collections. Basal cisterns are patent. Moderate calcific atherosclerosis of the carotid siphons.  No skull fracture. The included ocular globes and orbital contents are non-suspicious. The mastoid aircells and included paranasal sinuses are well-aerated. Patient is edentulous.  IMPRESSION: No acute intracranial process.  Chronic changes including moderate white chronic small vessel ischemic disease and  old small RIGHT cerebellar infarct.   Electronically Signed   By: Awilda Metro M.D.   On: 12/06/2014 06:55   Dg C-arm 1-60 Min  12/04/2014   CLINICAL DATA:  Left hip fracture, IM nail  EXAM: RIGHT FEMUR 2 VIEWS; DG C-ARM 61-120 MIN  FLUOROSCOPY TIME:  1 min 56 seconds  COMPARISON:  Left hip radiographs dated 12/04/2014  FINDINGS: Intraoperative fluoroscopic radiographs during IM nail with dynamic hip screw fixation of a subtrochanteric left hip fracture.  Fracture fragments are in near anatomic alignment and position.  IMPRESSION: Intraoperative fluoroscopic radiographs during ORIF of a subtrochanteric left hip fracture, as above.   Electronically Signed   By: Charline Bills M.D.   On: 12-04-2014 18:07   Dg Hip Unilat With Pelvis 2-3 Views Left  12-04-14   CLINICAL DATA:  Fall with left hip pain today.  Initial encounter.  EXAM: DG HIP (WITH OR WITHOUT PELVIS) 2-3V LEFT  COMPARISON:  None.  FINDINGS: An oblique mildly comminuted fracture of the proximal left femur is identified extending 15 cm from the greater trochanter inferiorly. 2 cm anterior and lateral displacement is identified.  There is no evidence of subluxation or dislocation.  Mild degenerative changes within both hips are present.   IMPRESSION: Oblique mildly comminuted displaced fracture of the proximal left femur as described.   Electronically Signed   By: Harmon Pier M.D.   On: 12-04-14 11:45   Dg Femur, Min 2 Views Right  12/04/14   CLINICAL DATA:  Left hip fracture, IM nail  EXAM: RIGHT FEMUR 2 VIEWS; DG C-ARM 61-120 MIN  FLUOROSCOPY TIME:  1 min 56 seconds  COMPARISON:  Left hip radiographs dated 12/04/14  FINDINGS: Intraoperative fluoroscopic radiographs during IM nail with dynamic hip screw fixation of a subtrochanteric left hip fracture.  Fracture fragments are in near anatomic alignment and position.  IMPRESSION: Intraoperative fluoroscopic radiographs during ORIF of a subtrochanteric left hip fracture, as above.   Electronically Signed   By: Charline Bills M.D.   On: 12/04/14 18:07     ASSESSMENT / PLAN:  PULMONARY OETT 7/11>>> Acute respiratory failure, r/t AMS and respiratory compensation for metabolic acidosis  P:   Intubate  Vent support - 8cc/kg  F/u CXR  F/u ABG Daily SBT   CARDIOVASCULAR CVL L IJ 7/11>>> Hypotension - mild  Hx HTN  Bradycardia  Wide QRS - likely r/t hyperkalemia - resolved  P:  Trend troponin  F/u lactate, pct  F/u chem  Bedside echo per Dr Tyson Alias - no RV dilation, hypovolemia likely  Volume resuscitation  CVL for CVPs    RENAL Acute on CKD  Hyperkalemia  Severe anion gap metabolic acidosis  P:   Volume  Bicarb gtt  q4h BMET  rx K PRN  F/u lactate  F/u ABG    GASTROINTESTINAL No active issue  P:   NPO  PPI   HEMATOLOGIC Anemia - mild  R/o blood loss  P:  Stat cbc  SCD's  Cont lovenox for now post op hip sx but monitor cbc closely   INFECTIOUS Leukocytosis  P:   BCx2 7/11>>> UC 7/11>>>  Empiric monotherapy ceftriaxone for now  F/u pct   ENDOCRINE DM   P:   SSI  Check tsh   NEUROLOGIC AMS/ obtundation - r/t severe metabolic acidosis  P:   RASS goal: -1 PRN fentanyl  Daily WUA    FAMILY  - Updates:  Daughter updated  at bedside 7/11  - Inter-disciplinary family meet  or Palliative Care meeting due by:  7/18   Dirk DressKaty Whiteheart, NP 12/14/2014  9:02 AM Pager: [phone] or [phone]   STAFF NOTE: Cindi CarbonI, Joshus Rogan, MD FACP have personally reviewed patient's available data, including medical history, events of note, physical examination and test results as part of my evaluation. I have discussed with resident/NP and other care providers such as pharmacist, RN and RRT. In addition, I personally evaluated patient and elicited key findings of: unresponsive, abdo exam re assuring, rt lower ext limited pulse but severe hypovolemic, concern low HCT post op and hypovolemia, I did stat limited echo, RV fxn WNL, LV cavity with gradient, need volume then re assess pulses, likely to  need vascular consult, will give 2.5 liters and re assess pulses prior to this, she has rt popliteal, reluctant to heparinize with concern drop hct post op, ETT placed, increase MV high, repeat abg, 8 cc/kg, empiric ceftriaxone, assess LA, cpk, get coags, may need prbc, junctional bradycardia improved with treatment hyper K empiric, add bicarb drip, kayxlate, will need to discuss code status , I updated daughter  4X2, assess arterial study, dopplers The patient is critically ill with multiple organ systems failure and requires high complexity decision making for assessment and support, frequent evaluation and titration of therapies, application of advanced monitoring technologies and extensive interpretation of multiple databases.   Critical Care Time devoted to patient care services described in this note is85 Minutes. This time reflects time of care of this signee: Rory Percyaniel Rhealynn Myhre, MD FACP. This critical care time does not reflect procedure time, or teaching time or supervisory time of PA/NP/Med student/Med Resident etc but could involve care discussion time. Rest per NP/medical resident whose note is outlined above and that I agree  with   Mcarthur Rossettianiel J. Tyson AliasFeinstein, MD, FACP Pgr: [phone] [facility] 12/15/2014 10:03 AM

## 2014-12-16 NOTE — Progress Notes (Signed)
Subjective: 1 Day Post-Op Procedure(s) (LRB): INTRAMEDULLARY (IM) NAIL INTERTROCHANTRIC (Left) Patient reports pain as 0 on 0-10 scale.  Called by ICU MD Fenstein that pt had been transferred to unit with anemia, acidosis, nonresponsive, cool lower extremities, bradycardia. CT head chronic changes , no acute changes.    Objective: Vital signs in last 24 hours: Temp:  [97.3 F (36.3 C)-98.2 F (36.8 C)] 98 F (36.7 C) (07/11 0559) Pulse Rate:  [50-90] 61 (07/11 0859) Resp:  [13-26] 23 (07/11 0859) BP: (72-232)/(37-115) 113/67 mmHg (07/11 0859) SpO2:  [91 %-100 %] 100 % (07/11 0859) FiO2 (%):  [1 %-100 %] 35 % (07/11 0917)  Intake/Output from previous day: 07/10 0701 - 07/11 0700 In: 700 [I.V.:700] Out: 100 [Blood:100] Intake/Output this shift:     Recent Labs  Jul 21, 2014 1608 Jul 21, 2014 2020 11/29/2014 0634 12/12/2014 0930 11/24/2014 1015  HGB 10.2* 8.8* 9.6* 7.1* 7.2*    Recent Labs  11/26/2014 0930 11/21/2014 1015  WBC 13.0* 14.2*  RBC 2.54* 2.59*  HCT 22.4* 22.9*  PLT 91* 99*    Recent Labs  12/01/2014 0634 12/10/2014 0930  NA 141 147*  K 5.3* 5.0  CL 110 118*  CO2 9* 11*  BUN 31* 31*  CREATININE 3.10* 3.11*  GLUCOSE 92 142*  CALCIUM 8.2* 8.5*    Recent Labs  Jul 21, 2014 1145  INR 1.16    left thigh minimal swelling no thigh hematoma.    Assessment/Plan: 1 Day Post-Op Procedure(s) (LRB): INTRAMEDULLARY (IM) NAIL INTERTROCHANTRIC (Left) Plan:   Support by ICU. She may have infarcted bowel, better pulses upper extremity vs lower. Both feet cold with trace cap refill. Had hypotension after morphine given  After fracture. Discussed critical condition of patient  With family members. . Multi-organ shut down.   YATES,MARK C 11/23/2014, 11:35 AM

## 2014-12-16 NOTE — Progress Notes (Signed)
Family Discussion Note:  I had a lengthy family discussion with the patient's children and grandchildren to explain her current clinical state of multi-system organ failure. Patient remains hypotensive on vasopressor support with no purposeful movement. Urine output is minimal. Given the patient's profound metabolic acidosis, acute liver failure, acute renal failure, and acute respiratory failure her prognosis is poor. The patient's family are all in agreement the patient would not wish to be artificially supported.  With her wishes in mind and their consent we have initiated comfort measures with Atropine and a Morphine drip. The patient will be terminally extubated with whatever family at bedside that wish to be there. I have offered chaplain support.

## 2014-12-16 NOTE — Consult Note (Signed)
      Patient name: Belinda MerlesHenrietta Hebel MRN: [id number] DOB: 1927/10/18 Sex: female   Referred by: Tyson AliasFeinstein  Reason for referral:  Chief Complaint  Patient presents with  . Fall  . Hip Pain    HISTORY OF PRESENT ILLNESS: Patient is a critically ill 81 year old female who had a fall with a left hip fracture. Underwent uneventful or medullary nail yesterday. Became the hypotensive with respiratory distress and was intubated last evening. He is critically ill with multisystem failure. No prior history of known peripheral vascular occlusive disease. We were consulted for cool ischemic bilateral lower extremities. The patient is currently intubated and is unable to give history. Is unresponsive and unable to determine whether she is having pain. Multiple family members are present  Past Medical History  Diagnosis Date  . Diabetes mellitus without complication   . Hypertension   . Arthritis     Past Surgical History  Procedure Laterality Date  . Intramedullary (im) nail intertrochanteric Left 18-Dec-2014    Procedure: INTRAMEDULLARY (IM) NAIL INTERTROCHANTRIC;  Surgeon: Eldred MangesMark C Yates, MD;  Location: [facility] OR;  Service: Orthopedics;  Laterality: Left;    History   Social History  . Marital Status: Single    Spouse Name: N/A  . Number of Children: N/A  . Years of Education: N/A   Occupational History  . Not on file.   Social History Main Topics  . Smoking status: Never Smoker   . Smokeless tobacco: Not on file  . Alcohol Use: No  . Drug Use: No  . Sexual Activity: Not on file   Other Topics Concern  . Not on file   Social History Narrative   Lives at home with grandson    History reviewed. No pertinent family history.  Allergies as of 003-Aug-2016  . (No Known Allergies)    No current facility-administered medications on file prior to encounter.   No current outpatient prescriptions on file prior to encounter.     REVIEW OF SYSTEMS:  Reviewed her history and  physical with nothing further to add PHYSICAL EXAMINATION:  General: The patient is a well-nourished female, i unresponsive on the vent Vital signs are BP 121/59 mmHg  Pulse 60  Temp(Src) 93.4 F (34.1 C) (Rectal)  Resp 21  Ht 5\' 6"  (1.676 m)  Wt 150 lb (68.04 kg)  BMI 24.22 kg/m2  SpO2 91% Pulmonary: There is a good air exchange  Musculoskeletal: There are no major deformities. Neurologic: Unresponsive on the vent Skin: There are no ulcer or rashes noted. Psychiatric: Unable to assess Cardiovascular: Palpable right popliteal pulse. Absent left popliteal pulse. No pulses in her feet Both feet cool and somewhat cyanotic. This is from the midcalf distally. No audible flow after dorsalis pedis posterior tibial bilaterally   Impression and Plan:  Critically ill on the vent. This does appear to be related to her hypotension. Blood pressure is currently in the 60s and 70s by a line. Do not feel that she is having any systemic response due to her lower extremity ischemia. Feel that this is related to hypoperfusion due to her contracted state. Would not recommend any other than resuscitation is being currently done. Discussed this with the family present as well. Gretta Began.    Breckon Reeves [facility] Office: [phone]

## 2014-12-16 NOTE — Progress Notes (Signed)
*  PRELIMINARY RESULTS* Vascular Ultrasound Bilateral Lower Extremity Arterial Duplex and Bilateral lower extremity venous duplex has been completed  ARTERIAL Study was technically limited due to patient position, recent hip surgery, and patient's inability to cooperate. The right distal popliteal artery appears to be occluded with collateralized flow to the proximal calf. The origin of the collateral from the right popliteal artery exhibits elevated velocities. There is severely dampened monophasic flow visualized in what appears to be the right peroneal artery, however this may be a collateral; unable to determine due to technical limitations.  Unable to evaluate most of the left lower extremity due to technical limitations. Limited visualization of the left calf and foot reveals no discernable arterial waveforms, suggestive of proximal obstruction/occlusion.   VENOUS The right lower extremity is negative for deep vein thrombosis.  Unable to evaluate most of the left lower extremity due to technical limitations.  Visualized veins of the distal left lower extremity exhibit no obvious evidence of deep vein thrombosis.  There is no evidence of right Baker's cyst. Unable to evaluate for left Baker's cyst.  Preliminary results discussed with Dr. Molli KnockYacoub.  12/02/2014  Belinda Newman, RVT, RDCS, RDMS

## 2014-12-16 NOTE — Procedures (Signed)
Central Venous Catheter Insertion Procedure Note Marguerita MerlesHenrietta Glatt [id number] May 31, 1927  Procedure: Insertion of Central Venous Catheter Indications: Assessment of intravascular volume, Drug and/or fluid administration and Frequent blood sampling  Procedure Details Consent: Risks of procedure as well as the alternatives and risks of each were explained to the (patient/caregiver).  Consent for procedure obtained. Time Out: Verified patient identification, verified procedure, site/side was marked, verified correct patient position, special equipment/implants available, medications/allergies/relevent history reviewed, required imaging and test results available.  Performed  Maximum sterile technique was used including antiseptics, cap, gloves, gown, hand hygiene, mask and sheet. Skin prep: Chlorhexidine; local anesthetic administered A antimicrobial bonded/coated triple lumen catheter was placed in the right internal jugular vein using the Seldinger technique.  Evaluation Blood flow good Complications: No apparent complications Patient did tolerate procedure well. Chest X-ray ordered to verify placement.  CXR: pending.  Nelda BucksFEINSTEIN,DANIEL J. 12/04/2014, 10:02 AM  With resident, perfrome dwith [place]  Mcarthur Rossettianiel J. Tyson AliasFeinstein, MD, FACP Pgr: [phone] [facility]

## 2014-12-16 NOTE — Progress Notes (Signed)
Chaplain arrived right after pt. Death.Marland Kitchen.  offered spiritual conversation to family.  Daughter and son present, daughter-in-law and grand-daughter. I offered presence and listening.Marland Kitchen.  asked the RN to call me if further support was needed.  Rev. Bonners FerryJan Hill, [place]Chaplain [phone]

## 2014-12-16 NOTE — Progress Notes (Signed)
Patient receiving blood, will draw and send labs shortly when finished

## 2014-12-16 NOTE — Progress Notes (Signed)
ABG done, possibly venous. Results pH 7.065, PCO2 20.2, PO2 53, HCO3 5.3, and SPO2 74%.   Dr. Celene SkeenNester gave order to increase tidal volume to 600ml per ABG.

## 2014-12-16 NOTE — Progress Notes (Signed)
[facility] called. Patient unresponsive to voice and pain.

## 2014-12-16 NOTE — Progress Notes (Signed)
RN went into the patients room to administer insulin sliding scale and Tylenol. Patient was unresponsive to pain and voice. RN called [facility] and Internal [facility]. RN ordered to give Narcan at 0609, 0620, and 0723. Patient was still unresponsive to pain and voice stimuli. [facility] RN had arrived by then. Also Rapid RN Eunice Blase(Debbie) and department director arrived as well. Internal Medicine arrived and bed placement was made for an ICU bed. Patient remained pale and cold with hypotension and hypoglycemia episodes. RN went with [facility] to [facility] room 1. RN gave report to Trinna PostAlex, Charity fundraiserN at [employer]30.

## 2014-12-16 DEATH — deceased

## 2016-03-06 IMAGING — CT CT HEAD W/O CM
1 series · 15 of 30 positions shown, 19 images · non-contrast
Comparison: None available for comparison at time of study
interpretation.

CLINICAL DATA: Lethargy, unresponsive. Status post LEFT hip ORIF
November 24, 2014. History of diabetes and hypertension.

EXAM:
CT HEAD WITHOUT CONTRAST
TECHNIQUE: Contiguous axial images were obtained from the base of the skull
through the vertex without intravenous contrast.

[Series 2: head 5.0 h31s · axial · 0.47mm/px · z∈[-112,+33]mm · 15 of 33 slices shown, 19 images]
[im 2/33  brain]
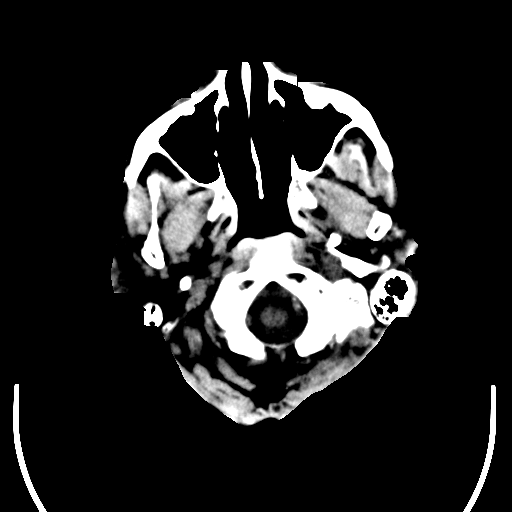
[im 2/33  bone]
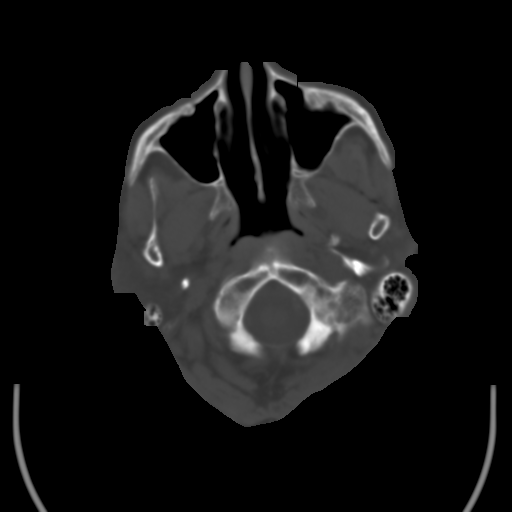
[im 4/33  brain]
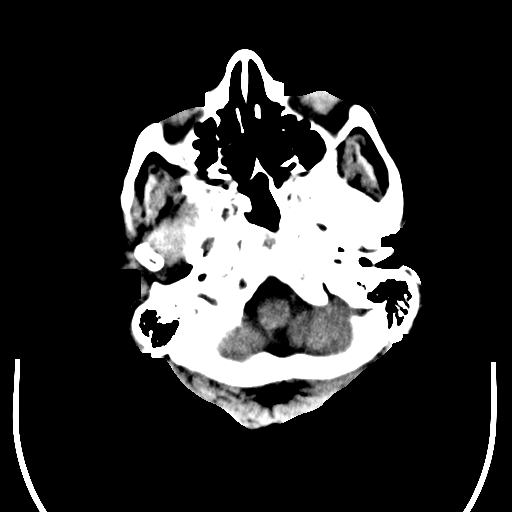
[im 6/33  brain]
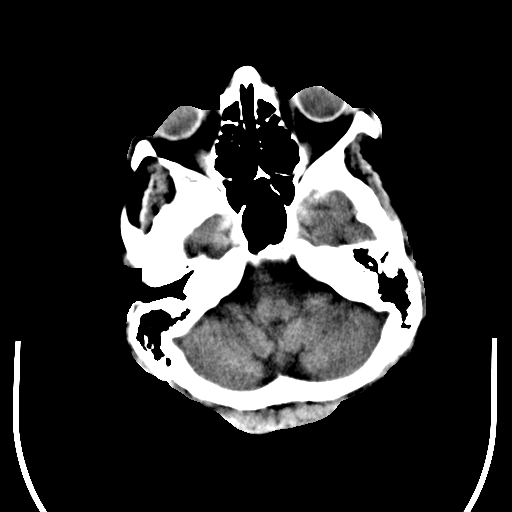
[im 8/33  brain]
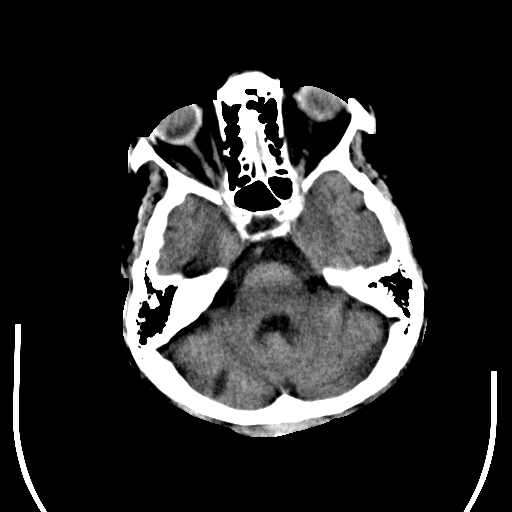
[im 10/33  brain]
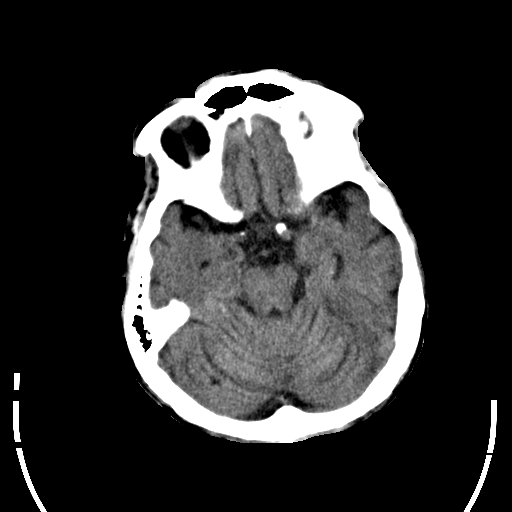
[im 10/33  bone]
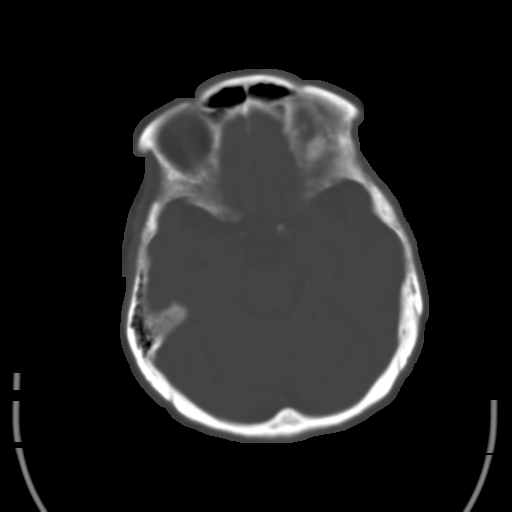
[im 13/33  brain]
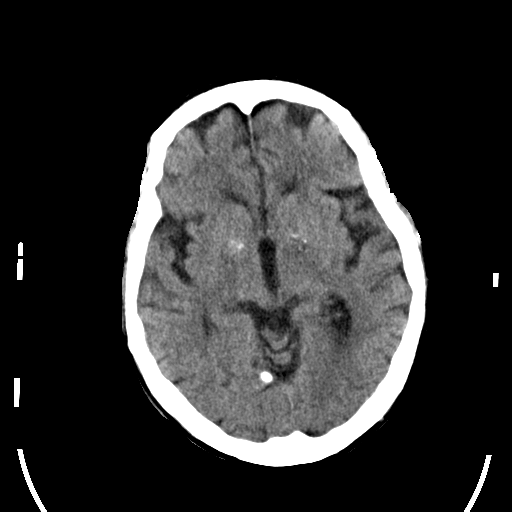
[im 15/33  brain]
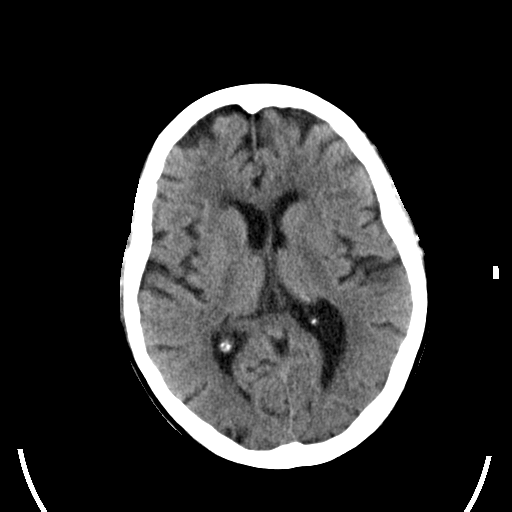
[im 17/33  brain]
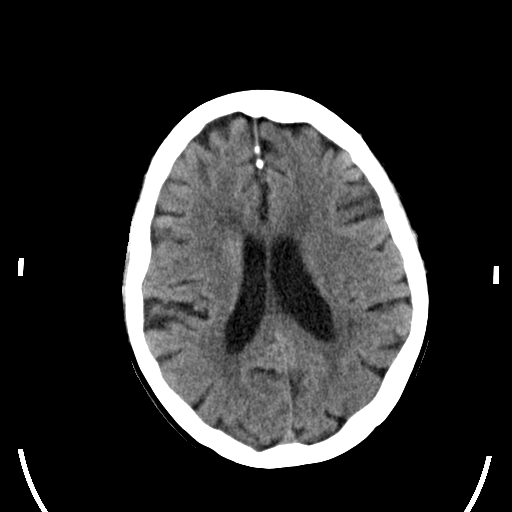
[im 18/33  brain]
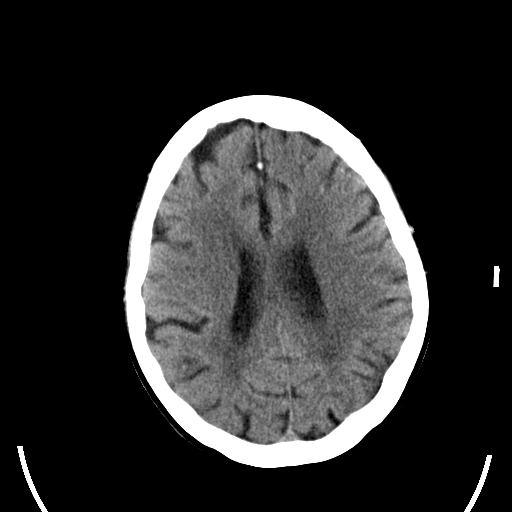
[im 18/33  bone]
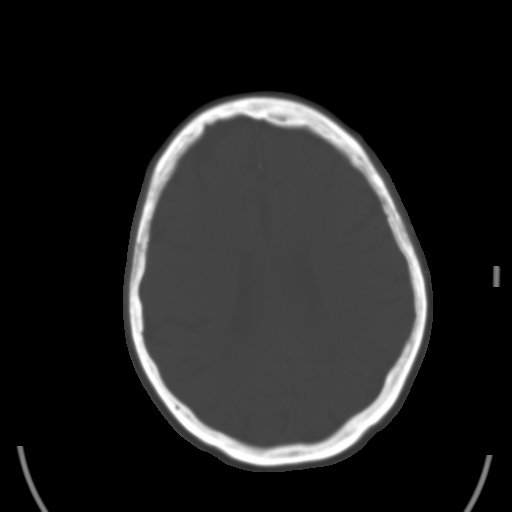
[im 20/33  brain]
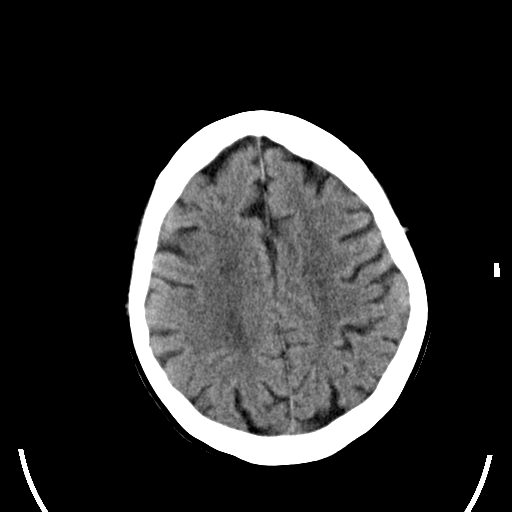
[im 23/33  brain]
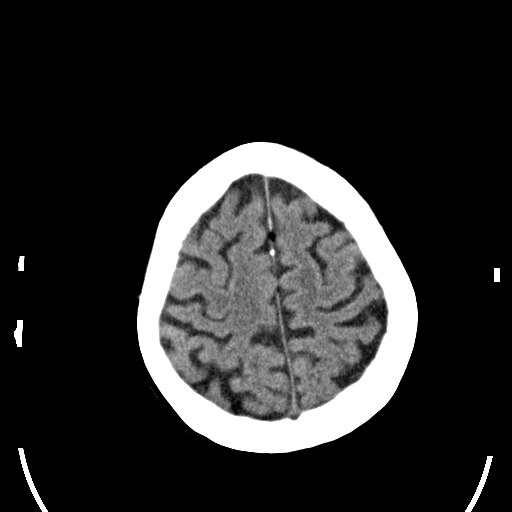
[im 25/33  brain]
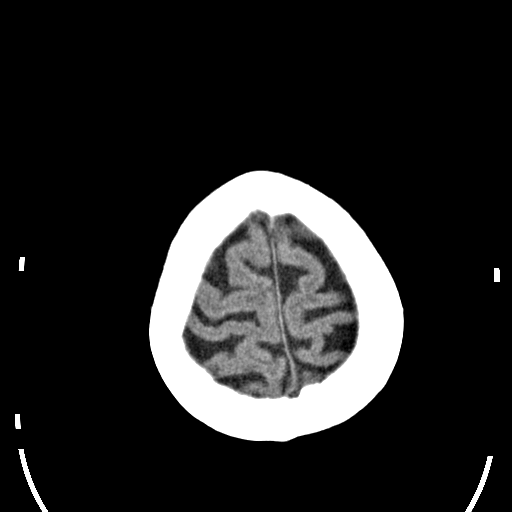
[im 27/33  brain]
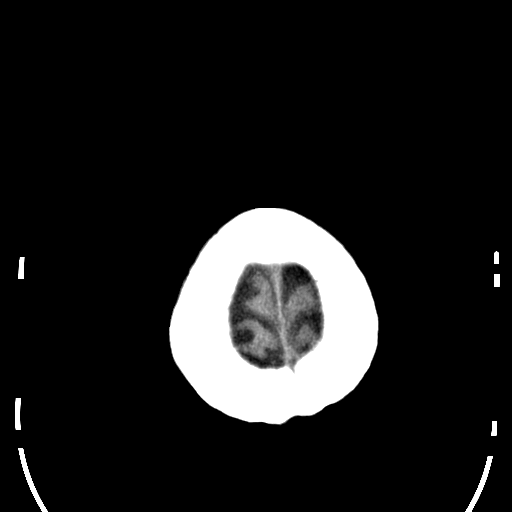
[im 27/33  bone]
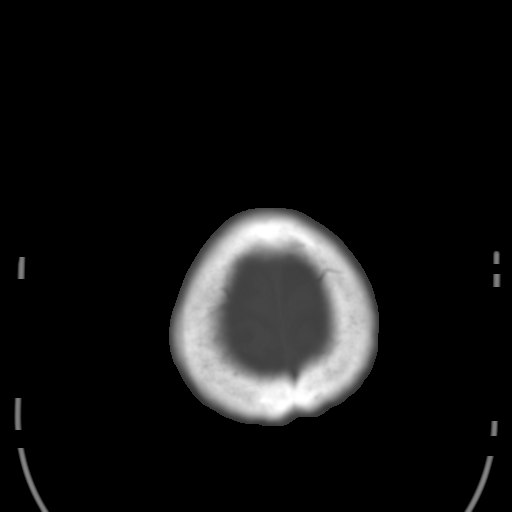
[im 29/33  brain]
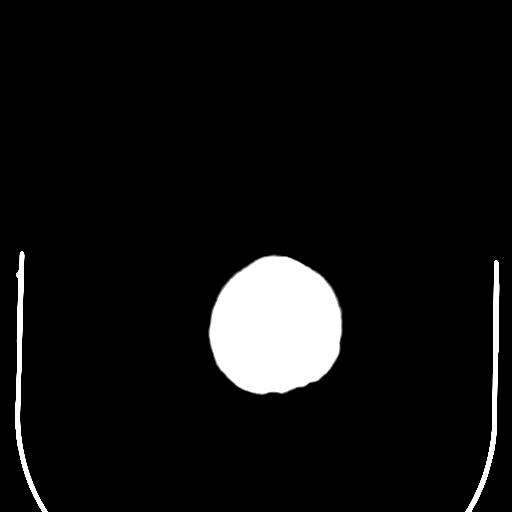
[im 31/33  brain]
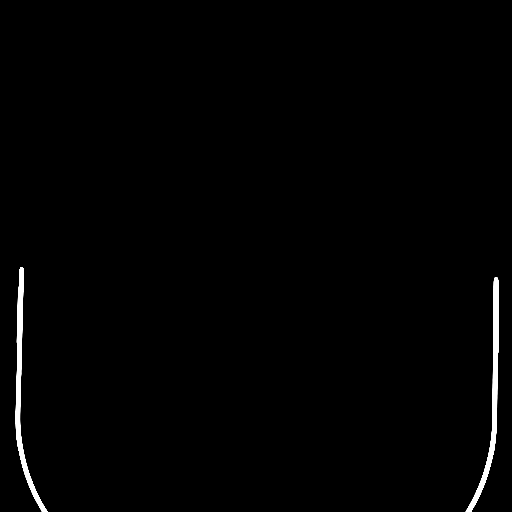

[15 of 30 positions shown; findings below may reference images not displayed]

FINDINGS: The ventricles and sulci are normal for age. No intraparenchymal
hemorrhage, mass effect nor midline shift. Patchy to confluent
supratentorial white matter hypodensities are within normal range
for patient's age and though non-specific suggest sequelae of
chronic small vessel ischemic disease. No acute large vascular
territory infarcts. Old small RIGHT inferior cerebellar infarct.

No abnormal extra-axial fluid collections. Basal cisterns are
patent. Moderate calcific atherosclerosis of the carotid siphons.

No skull fracture. The included ocular globes and orbital contents
are non-suspicious. The mastoid aircells and included paranasal
sinuses are well-aerated. Patient is edentulous.
IMPRESSION: No acute intracranial process.

Chronic changes including moderate white chronic small vessel
ischemic disease and old small RIGHT cerebellar infarct.
